# Patient Record
Sex: Female | Born: 1985 | Race: Black or African American | Hispanic: No | Marital: Single | Smoking: Current every day smoker
Health system: Southern US, Community
[De-identification: ages and names within clinical notes are randomized; demographics above are authoritative.]

## PROBLEM LIST (undated history)

## (undated) DIAGNOSIS — G43909 Migraine, unspecified, not intractable, without status migrainosus: Secondary | ICD-10-CM

## (undated) DIAGNOSIS — R011 Cardiac murmur, unspecified: Secondary | ICD-10-CM

## (undated) DIAGNOSIS — N939 Abnormal uterine and vaginal bleeding, unspecified: Secondary | ICD-10-CM

## (undated) HISTORY — PX: WISDOM TOOTH EXTRACTION: SHX21

---
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
## 2007-06-04 ENCOUNTER — Emergency Department (HOSPITAL_COMMUNITY): Admission: EM | Admit: 2007-06-04 | Discharge: 2007-06-04 | Payer: Self-pay | Admitting: Family Medicine

## 2008-02-18 ENCOUNTER — Inpatient Hospital Stay (HOSPITAL_COMMUNITY): Admission: AD | Admit: 2008-02-18 | Discharge: 2008-02-19 | Payer: Self-pay | Admitting: Obstetrics & Gynecology

## 2008-04-10 ENCOUNTER — Ambulatory Visit (HOSPITAL_COMMUNITY): Admission: RE | Admit: 2008-04-10 | Discharge: 2008-04-10 | Payer: Self-pay | Admitting: Obstetrics

## 2008-07-23 ENCOUNTER — Inpatient Hospital Stay (HOSPITAL_COMMUNITY): Admission: AD | Admit: 2008-07-23 | Discharge: 2008-08-01 | Payer: Self-pay | Admitting: Obstetrics

## 2008-07-30 ENCOUNTER — Encounter (INDEPENDENT_AMBULATORY_CARE_PROVIDER_SITE_OTHER): Payer: Self-pay | Admitting: Obstetrics

## 2008-09-24 ENCOUNTER — Emergency Department (HOSPITAL_COMMUNITY): Admission: EM | Admit: 2008-09-24 | Discharge: 2008-09-24 | Payer: Self-pay | Admitting: Emergency Medicine

## 2009-05-18 ENCOUNTER — Emergency Department (HOSPITAL_COMMUNITY): Admission: EM | Admit: 2009-05-18 | Discharge: 2009-05-18 | Payer: Self-pay | Admitting: Emergency Medicine

## 2009-09-27 ENCOUNTER — Emergency Department (HOSPITAL_COMMUNITY): Admission: EM | Admit: 2009-09-27 | Discharge: 2009-09-27 | Payer: Self-pay | Admitting: Emergency Medicine

## 2009-10-27 ENCOUNTER — Emergency Department (HOSPITAL_COMMUNITY): Admission: EM | Admit: 2009-10-27 | Discharge: 2009-10-27 | Payer: Self-pay | Admitting: Emergency Medicine

## 2010-03-16 LAB — CBC
MCH: 25.4 pg — ABNORMAL LOW (ref 26.0–34.0)
MCHC: 32.2 g/dL (ref 30.0–36.0)
MCV: 79.1 fL (ref 78.0–100.0)
Platelets: 164 10*3/uL (ref 150–400)
RDW: 14.2 % (ref 11.5–15.5)

## 2010-03-16 LAB — DIFFERENTIAL
Basophils Absolute: 0 10*3/uL (ref 0.0–0.1)
Lymphocytes Relative: 43 % (ref 12–46)
Lymphs Abs: 3.2 10*3/uL (ref 0.7–4.0)
Monocytes Absolute: 0.4 10*3/uL (ref 0.1–1.0)
Neutro Abs: 3.6 10*3/uL (ref 1.7–7.7)

## 2010-03-16 LAB — COMPREHENSIVE METABOLIC PANEL
AST: 20 U/L (ref 0–37)
Albumin: 4.3 g/dL (ref 3.5–5.2)
BUN: 14 mg/dL (ref 6–23)
Calcium: 9.6 mg/dL (ref 8.4–10.5)
Creatinine, Ser: 0.74 mg/dL (ref 0.4–1.2)
GFR calc Af Amer: >60 mL/min (ref >60)
Total Bilirubin: 0.5 mg/dL (ref 0.3–1.2)
Total Protein: 7.1 g/dL (ref 6.0–8.3)

## 2010-03-16 LAB — URINALYSIS, ROUTINE W REFLEX MICROSCOPIC
Glucose, UA: NEGATIVE mg/dL
Protein, ur: NEGATIVE mg/dL
Specific Gravity, Urine: 1.034 — ABNORMAL HIGH (ref 1.005–1.030)
Urobilinogen, UA: 1 mg/dL (ref 0.0–1.0)

## 2010-03-16 LAB — URINE CULTURE
Colony Count: NO GROWTH
Culture  Setup Time: 201110262100

## 2010-03-16 LAB — URINE MICROSCOPIC-ADD ON

## 2010-03-16 LAB — RAPID URINE DRUG SCREEN, HOSP PERFORMED: Barbiturates: NOT DETECTED

## 2010-03-21 LAB — DIFFERENTIAL
Basophils Relative: 1 % (ref 0–1)
Eosinophils Absolute: 0.2 10*3/uL (ref 0.0–0.7)
Eosinophils Relative: 2 % (ref 0–5)
Monocytes Absolute: 0.6 10*3/uL (ref 0.1–1.0)
Monocytes Relative: 7 % (ref 3–12)

## 2010-03-21 LAB — POCT I-STAT, CHEM 8
Chloride: 109 mEq/L (ref 96–112)
Creatinine, Ser: 0.9 mg/dL (ref 0.4–1.2)
Glucose, Bld: 95 mg/dL (ref 70–99)
HCT: 37 % (ref 36.0–46.0)
Hemoglobin: 12.6 g/dL (ref 12.0–15.0)
Potassium: 4.1 mEq/L (ref 3.5–5.1)
Sodium: 137 mEq/L (ref 135–145)

## 2010-03-21 LAB — CBC
Hemoglobin: 11.9 g/dL — ABNORMAL LOW (ref 12.0–15.0)
MCHC: 33.4 g/dL (ref 30.0–36.0)
MCV: 79.9 fL (ref 78.0–100.0)
RBC: 4.44 MIL/uL (ref 3.87–5.11)

## 2010-04-10 LAB — CBC
HCT: 33.2 % — ABNORMAL LOW (ref 36.0–46.0)
Hemoglobin: 11.4 g/dL — ABNORMAL LOW (ref 12.0–15.0)
MCHC: 32.8 g/dL (ref 30.0–36.0)
MCHC: 33.8 g/dL (ref 30.0–36.0)
MCHC: 34.1 g/dL (ref 30.0–36.0)
MCV: 82.9 fL (ref 78.0–100.0)
MCV: 83.1 fL (ref 78.0–100.0)
MCV: 83.1 fL (ref 78.0–100.0)
Platelets: 162 10*3/uL (ref 150–400)
Platelets: 192 10*3/uL (ref 150–400)
RBC: 3.49 MIL/uL — ABNORMAL LOW (ref 3.87–5.11)
RBC: 4.01 MIL/uL (ref 3.87–5.11)
RBC: 4.18 MIL/uL (ref 3.87–5.11)
RDW: 14.6 % (ref 11.5–15.5)
WBC: 10.5 10*3/uL (ref 4.0–10.5)
WBC: 10.8 10*3/uL — ABNORMAL HIGH (ref 4.0–10.5)
WBC: 10.9 10*3/uL — ABNORMAL HIGH (ref 4.0–10.5)
WBC: 9.4 10*3/uL (ref 4.0–10.5)

## 2010-04-10 LAB — URINALYSIS, ROUTINE W REFLEX MICROSCOPIC
Bilirubin Urine: NEGATIVE
Glucose, UA: NEGATIVE mg/dL
Ketones, ur: NEGATIVE mg/dL
Protein, ur: 100 mg/dL — AB
Urobilinogen, UA: 0.2 mg/dL (ref 0.0–1.0)

## 2010-04-10 LAB — COMPREHENSIVE METABOLIC PANEL
ALT: 13 U/L (ref 0–35)
ALT: 13 U/L (ref 0–35)
ALT: 14 U/L (ref 0–35)
AST: 22 U/L (ref 0–37)
AST: 23 U/L (ref 0–37)
AST: 25 U/L (ref 0–37)
Albumin: 2.8 g/dL — ABNORMAL LOW (ref 3.5–5.2)
Alkaline Phosphatase: 103 U/L (ref 39–117)
BUN: 5 mg/dL — ABNORMAL LOW (ref 6–23)
CO2: 20 mEq/L (ref 19–32)
CO2: 22 mEq/L (ref 19–32)
CO2: 25 mEq/L (ref 19–32)
Calcium: 7.3 mg/dL — ABNORMAL LOW (ref 8.4–10.5)
Calcium: 9 mg/dL (ref 8.4–10.5)
Chloride: 103 mEq/L (ref 96–112)
Chloride: 108 mEq/L (ref 96–112)
Chloride: 108 mEq/L (ref 96–112)
Chloride: 109 mEq/L (ref 96–112)
Creatinine, Ser: 0.63 mg/dL (ref 0.4–1.2)
GFR calc Af Amer: >60 mL/min (ref >60)
GFR calc Af Amer: >60 mL/min (ref >60)
GFR calc Af Amer: >60 mL/min (ref >60)
GFR calc non Af Amer: >60 mL/min (ref >60)
GFR calc non Af Amer: >60 mL/min (ref >60)
GFR calc non Af Amer: >60 mL/min (ref >60)
GFR calc non Af Amer: >60 mL/min (ref >60)
Glucose, Bld: 93 mg/dL (ref 70–99)
Sodium: 134 mEq/L — ABNORMAL LOW (ref 135–145)
Sodium: 136 mEq/L (ref 135–145)
Sodium: 138 mEq/L (ref 135–145)
Total Bilirubin: 0.4 mg/dL (ref 0.3–1.2)
Total Bilirubin: 0.5 mg/dL (ref 0.3–1.2)
Total Bilirubin: 0.6 mg/dL (ref 0.3–1.2)

## 2010-04-10 LAB — CREATININE CLEARANCE, URINE, 24 HOUR
Creatinine Clearance: 196 mL/min — ABNORMAL HIGH (ref 75–115)
Creatinine, 24H Ur: 1783 mg/d (ref 700–1800)
Creatinine, Urine: 59.4 mg/dL
Creatinine: 0.63 mg/dL (ref 0.40–1.20)

## 2010-04-10 LAB — URINE MICROSCOPIC-ADD ON

## 2010-04-10 LAB — PROTEIN, URINE, 24 HOUR: Urine Total Volume-UPROT: 3000 mL

## 2010-04-10 LAB — LACTATE DEHYDROGENASE: LDH: 139 U/L (ref 94–250)

## 2010-04-19 LAB — URINALYSIS, ROUTINE W REFLEX MICROSCOPIC
Glucose, UA: NEGATIVE mg/dL
Ketones, ur: NEGATIVE mg/dL
Specific Gravity, Urine: >1.03 — ABNORMAL HIGH (ref 1.005–1.030)
pH: 6 (ref 5.0–8.0)

## 2010-04-19 LAB — CBC
HCT: 32.3 % — ABNORMAL LOW (ref 36.0–46.0)
Hemoglobin: 10.6 g/dL — ABNORMAL LOW (ref 12.0–15.0)
RBC: 3.91 MIL/uL (ref 3.87–5.11)
RDW: 13.9 % (ref 11.5–15.5)
WBC: 9.4 10*3/uL (ref 4.0–10.5)

## 2010-04-19 LAB — WET PREP, GENITAL
Trich, Wet Prep: NONE SEEN
Yeast Wet Prep HPF POC: NONE SEEN

## 2010-05-17 NOTE — Discharge Summary (Signed)
[name], [name]             ACCOUNT NO.:  [id number]   MEDICAL RECORD NO.:  [id number]          PATIENT TYPE:  INP   LOCATION:  9146                          FACILITY:  [facility]   PHYSICIAN:  Kathreen Cosier, M.D.DATE OF BIRTH:  [birth year]   DATE OF ADMISSION:  07/23/2008  DATE OF DISCHARGE:                               DISCHARGE SUMMARY   The patient is a 25 year old gravida 1, EDC August 19, 2008.  She was  seen in the office on July 22 with a blood pressure of 130/100,  asymptomatic.  PIH labs were normal with her blood pressures on  admission were in the 90s.  She was admitted and seen by MFM.  A 24-hour  urine collection showed greater than the 3100 g of protein.  Her blood  pressures remained normal and MFM suggested induction at 37 weeks, so  she was induced on July 29, 2008.  Her PIH labs were normal.  Platelets  180.  She had Cytotec inserted and then had a vaginal delivery of female  Apgars 3 and 8, weighing 4 pound and 11ounces, pH was 7.33.  She was on  magnesium sulfate during labor and was transferred to the unit  postdelivery.  Her blood pressures remained normal.  Her magnesium was  discontinued by day #1 postpartum, and by day #2, blood pressures had  all remained normal.  She was discharged home on the second postpartum  day ambulatory and on a regular diet, on Tylenol No. 3 for pain and  ferrous sulfate for her anemia, hemoglobin being 9.7.           ______________________________  Kathreen Cosier, M.D.     BAM/MEDQ  D:  08/01/2008  T:  08/01/2008  Job:  [id number]

## 2010-09-29 LAB — POCT PREGNANCY, URINE: Preg Test, Ur: NEGATIVE

## 2010-12-11 ENCOUNTER — Emergency Department (HOSPITAL_COMMUNITY)
Admission: EM | Admit: 2010-12-11 | Discharge: 2010-12-11 | Disposition: A | Discharge status: Home / Self Care | Payer: Medicaid Other | Attending: Emergency Medicine | Admitting: Emergency Medicine

## 2010-12-11 ENCOUNTER — Encounter: Payer: Self-pay | Admitting: Emergency Medicine

## 2010-12-11 DIAGNOSIS — J069 Acute upper respiratory infection, unspecified: Secondary | ICD-10-CM | POA: Insufficient documentation

## 2010-12-11 DIAGNOSIS — B9789 Other viral agents as the cause of diseases classified elsewhere: Secondary | ICD-10-CM | POA: Insufficient documentation

## 2010-12-11 DIAGNOSIS — B349 Viral infection, unspecified: Secondary | ICD-10-CM

## 2010-12-11 DIAGNOSIS — R070 Pain in throat: Secondary | ICD-10-CM | POA: Insufficient documentation

## 2010-12-11 HISTORY — DX: Cardiac murmur, unspecified: R01.1

## 2010-12-11 MED ORDER — ALBUTEROL SULFATE HFA 108 (90 BASE) MCG/ACT IN AERS: 2.0000 | INHALATION_SPRAY | RESPIRATORY_TRACT | Status: DC | PRN

## 2010-12-11 MED ORDER — IBUPROFEN 200 MG PO TABS
600.0000 mg | ORAL_TABLET | Freq: Once | ORAL | Status: AC
  Administered 2010-12-11: 600 mg via ORAL
  Filled 2010-12-11: qty 3

## 2010-12-11 NOTE — ED Provider Notes (Addendum)
History     CSN: [id number] Arrival date & time: 12/11/2010  6:42 PM   First MD Initiated Contact with Patient 12/11/10 1933      Chief Complaint  Patient presents with  . Sore Throat    (Consider location/radiation/quality/duration/timing/severity/associated sxs/prior treatment) HPI Pt c/o sore/scratchy throat, body aches, nasal congestion, non productive cough for past 1-2 days. No fever. No known ill contacts. No trouble breathing or swallowing. No abd pain. No  Nvd. Symptoms constant since onset, no specific exacerbating or alleviating factors.     Past Medical History  Diagnosis Date  . Heart murmur     History reviewed. No pertinent past surgical history.  No family history on file.  History  Substance Use Topics  . Smoking status: Never Smoker   . Smokeless tobacco: Not on file  . Alcohol Use: No    OB History    Grav Para Term Preterm Abortions TAB SAB Ect Mult Living                  Review of Systems  Constitutional: Negative for fever and chills.  Respiratory: Negative for shortness of breath.   Cardiovascular: Negative for chest pain.  Skin: Negative for rash.  Neurological: Negative for headaches.    Allergies  Review of patient's allergies indicates no known allergies.  Home Medications   Current Outpatient Rx  Name Route Sig Dispense Refill  . OVER THE COUNTER MEDICATION Oral Take 2 tablets by mouth every 6 (six) hours. Tylenol cold and flu       BP 122/71  Pulse 90  Temp(Src) 98.2 F (36.8 C) (Oral)  Resp 18  SpO2 100%  LMP 12/04/2010  Physical Exam  Nursing note and vitals reviewed. Constitutional: She is oriented to person, place, and time. She appears well-developed and well-nourished. No distress.  HENT:  Mouth/Throat: Oropharynx is clear and moist.  Eyes: Conjunctivae are normal. No scleral icterus.  Neck: Neck supple. No tracheal deviation present.       No stiffness or rigidity  Cardiovascular: Normal rate, regular  rhythm, normal heart sounds and intact distal pulses.  Exam reveals no gallop and no friction rub.   No murmur heard. Pulmonary/Chest: Effort normal and breath sounds normal. No respiratory distress.  Abdominal: Soft. Normal appearance. She exhibits no distension. There is no tenderness.  Musculoskeletal: She exhibits no edema.  Neurological: She is alert and oriented to person, place, and time.  Skin: Skin is warm and dry. No rash noted.  Psychiatric: She has a normal mood and affect.    ED Course  Procedures (including critical care time)     MDM   Motrin po. pts exam/symptoms c/w viral uri.        Suzi Roots, MD 12/11/10 1941  Suzi Roots, MD 03/18/11 [phone]

## 2010-12-11 NOTE — ED Notes (Signed)
Patient states has been feeling sick x 3 days reports fevers and body aches with a productive cough

## 2010-12-11 NOTE — ED Notes (Signed)
C/o sore throat, sob, generalized body aches, fever, congestion, and productive cough with clear sputum since yesterday.

## 2010-12-12 ENCOUNTER — Emergency Department (HOSPITAL_COMMUNITY)
Admission: EM | Admit: 2010-12-12 | Discharge: 2010-12-12 | Disposition: A | Discharge status: Home / Self Care | Payer: Medicaid Other | Attending: Emergency Medicine | Admitting: Emergency Medicine

## 2010-12-12 ENCOUNTER — Encounter (HOSPITAL_COMMUNITY): Payer: Self-pay

## 2010-12-12 DIAGNOSIS — IMO0001 Reserved for inherently not codable concepts without codable children: Secondary | ICD-10-CM | POA: Insufficient documentation

## 2010-12-12 DIAGNOSIS — H9210 Otorrhea, unspecified ear: Secondary | ICD-10-CM | POA: Insufficient documentation

## 2010-12-12 DIAGNOSIS — J208 Acute bronchitis due to other specified organisms: Secondary | ICD-10-CM

## 2010-12-12 DIAGNOSIS — J209 Acute bronchitis, unspecified: Secondary | ICD-10-CM | POA: Insufficient documentation

## 2010-12-12 DIAGNOSIS — R0602 Shortness of breath: Secondary | ICD-10-CM | POA: Insufficient documentation

## 2010-12-12 DIAGNOSIS — R011 Cardiac murmur, unspecified: Secondary | ICD-10-CM | POA: Insufficient documentation

## 2010-12-12 MED ORDER — IBUPROFEN 800 MG PO TABS
800.0000 mg | ORAL_TABLET | Freq: Once | ORAL | Status: AC
  Administered 2010-12-12: 800 mg via ORAL
  Filled 2010-12-12 (×2): qty 1

## 2010-12-12 MED ORDER — DIPHENHYDRAMINE HCL 25 MG PO CAPS
25.0000 mg | ORAL_CAPSULE | Freq: Four times a day (QID) | ORAL | Status: DC | PRN
  Administered 2010-12-12: 25 mg via ORAL
  Filled 2010-12-12 (×2): qty 1

## 2010-12-12 NOTE — ED Notes (Signed)
Pt was seen last night and given rx for albuteral has not gotten it filled states has gotten a rash on her left fingers and marks on her neck and left shoulder pt states that she is still having trouble breathing. Pt is hoarse

## 2010-12-12 NOTE — ED Notes (Signed)
Patient presents with shortness of breath since last night after taking albuterol and tylenol cold and sinus. Patient seen yesterday in ED for upper respiratory infection.  No wheezing noted upon auscultation.  Patient speaking in short, complete sentences.

## 2011-01-13 NOTE — ED Provider Notes (Signed)
History     CSN: [id number]  Arrival date & time 12/12/10  7846   First MD Initiated Contact with Patient 12/12/10 1102      Chief Complaint  Patient presents with  . Shortness of Breath    (Consider location/radiation/quality/duration/timing/severity/associated sxs/prior treatment) Patient is a 26 y.o. female presenting with shortness of breath. The history is provided by the patient. No language interpreter was used.  Shortness of Breath  The current episode started today. The onset was gradual. The problem occurs occasionally. The problem has been unchanged. The problem is mild. The symptoms are relieved by nothing. Associated symptoms include shortness of breath. Pertinent negatives include no chest pain, no chest pressure, no orthopnea, no fever, no rhinorrhea, no sore throat, no cough and no wheezing. She was not exposed to toxic fumes. She has been behaving normally. Recently, medical care has been given at this facility.    Past Medical History  Diagnosis Date  . Heart murmur     History reviewed. No pertinent past surgical history.  History reviewed. No pertinent family history.  History  Substance Use Topics  . Smoking status: Never Smoker   . Smokeless tobacco: Not on file  . Alcohol Use: No    OB History    Grav Para Term Preterm Abortions TAB SAB Ect Mult Living                  Review of Systems  Constitutional: Negative for fever.  HENT: Negative for sore throat and rhinorrhea.   Respiratory: Positive for shortness of breath. Negative for cough and wheezing.   Cardiovascular: Negative for chest pain and orthopnea.  All other systems reviewed and are negative.    Allergies  Acetaminophen and Codeine  Home Medications   Current Outpatient Rx  Name Route Sig Dispense Refill  . ALBUTEROL SULFATE HFA 108 (90 BASE) MCG/ACT IN AERS Inhalation Inhale 2 puffs into the lungs every 4 (four) hours as needed for wheezing. 1 Inhaler 0  . DIPHENHYDRAMINE  HCL 2 % EX CREA Topical Apply 1 application topically 3 (three) times daily as needed. For rash and itching     . OVER THE COUNTER MEDICATION Oral Take 2 tablets by mouth every 6 (six) hours. Tylenol cold and flu      BP 132/79  Pulse 96  Temp(Src) 97.8 F (36.6 C) (Oral)  Resp 18  SpO2 100%  LMP 12/04/2010  Physical Exam  Nursing note and vitals reviewed. Constitutional: She is oriented to person, place, and time. She appears well-nourished. No distress.  HENT:  Head: Normocephalic.  Right Ear: Hearing and tympanic membrane normal. No drainage or tenderness. Tympanic membrane is not injected.  Left Ear: Hearing and tympanic membrane normal. There is drainage. No tenderness. Tympanic membrane is not injected.  Mouth/Throat: Uvula is midline and mucous membranes are normal. No oropharyngeal exudate, posterior oropharyngeal edema, posterior oropharyngeal erythema or tonsillar abscesses.  Eyes: Pupils are equal, round, and reactive to light.  Neck: Normal range of motion.  Cardiovascular: Normal rate.   Murmur heard. Pulmonary/Chest: No respiratory distress. She has no wheezes.  Abdominal: Soft. She exhibits no distension. There is no tenderness.  Musculoskeletal: She exhibits tenderness.       General body aches  Neurological: She is alert and oriented to person, place, and time. No cranial nerve deficit.  Skin: Skin is warm and dry.  Psychiatric: She has a normal mood and affect.    ED Course  Procedures (including critical care time)  Labs Reviewed - No data to display No results found.   1. Viral bronchitis       MDM  General body aches and pains with ? Fever.  No fever in the ER today or yesterday.  Will add ibuprofen and benadryl with good results in ER today.  Lungs clear.  Better after breathing treatment.  No tachycardia or SOB in ER today; Will follow up with a PCP this week.    Medical screening examination/treatment/procedure(s) were performed by  non-physician practitioner and as supervising physician I was immediately available for consultation/collaboration. Osvaldo Human, M.D.      Jethro Bastos, NP 01/13/11 1606  Carleene Cooper III, MD 01/14/11 1316

## 2012-01-15 ENCOUNTER — Emergency Department (HOSPITAL_COMMUNITY): Payer: Medicaid Other

## 2012-01-15 ENCOUNTER — Emergency Department (HOSPITAL_COMMUNITY)
Admission: EM | Admit: 2012-01-15 | Discharge: 2012-01-15 | Disposition: A | Discharge status: Home / Self Care | Payer: Medicaid Other | Attending: Emergency Medicine | Admitting: Emergency Medicine

## 2012-01-15 ENCOUNTER — Encounter (HOSPITAL_COMMUNITY): Payer: Self-pay

## 2012-01-15 DIAGNOSIS — G43909 Migraine, unspecified, not intractable, without status migrainosus: Secondary | ICD-10-CM | POA: Insufficient documentation

## 2012-01-15 DIAGNOSIS — R079 Chest pain, unspecified: Secondary | ICD-10-CM | POA: Insufficient documentation

## 2012-01-15 DIAGNOSIS — R110 Nausea: Secondary | ICD-10-CM | POA: Insufficient documentation

## 2012-01-15 DIAGNOSIS — H53149 Visual discomfort, unspecified: Secondary | ICD-10-CM | POA: Insufficient documentation

## 2012-01-15 DIAGNOSIS — K219 Gastro-esophageal reflux disease without esophagitis: Secondary | ICD-10-CM | POA: Insufficient documentation

## 2012-01-15 DIAGNOSIS — R011 Cardiac murmur, unspecified: Secondary | ICD-10-CM | POA: Insufficient documentation

## 2012-01-15 HISTORY — DX: Migraine, unspecified, not intractable, without status migrainosus: G43.909

## 2012-01-15 LAB — CBC WITH DIFFERENTIAL/PLATELET
Basophils Relative: 1 % (ref 0–1)
Eosinophils Absolute: 0.1 10*3/uL (ref 0.0–0.7)
Eosinophils Relative: 2 % (ref 0–5)
MCH: 24.6 pg — ABNORMAL LOW (ref 26.0–34.0)
MCHC: 31.5 g/dL (ref 30.0–36.0)
MCV: 78.1 fL (ref 78.0–100.0)
Neutrophils Relative %: 53 % (ref 43–77)
Platelets: 219 10*3/uL (ref 150–400)

## 2012-01-15 LAB — BASIC METABOLIC PANEL
BUN: 16 mg/dL (ref 6–23)
Calcium: 9.7 mg/dL (ref 8.4–10.5)
GFR calc Af Amer: >90 mL/min (ref >90)
GFR calc non Af Amer: >90 mL/min (ref >90)
Potassium: 4 mEq/L (ref 3.5–5.1)
Sodium: 140 mEq/L (ref 135–145)

## 2012-01-15 MED ORDER — DIPHENHYDRAMINE HCL 50 MG/ML IJ SOLN
25.0000 mg | Freq: Once | INTRAMUSCULAR | Status: AC
  Administered 2012-01-15: 25 mg via INTRAVENOUS
  Filled 2012-01-15: qty 1

## 2012-01-15 MED ORDER — DEXAMETHASONE SODIUM PHOSPHATE 10 MG/ML IJ SOLN
10.0000 mg | Freq: Once | INTRAMUSCULAR | Status: AC
  Administered 2012-01-15: 10 mg via INTRAVENOUS
  Filled 2012-01-15: qty 1

## 2012-01-15 MED ORDER — OXYCODONE-ACETAMINOPHEN 5-325 MG PO TABS: 1.0000 | ORAL_TABLET | Freq: Four times a day (QID) | ORAL | Status: DC | PRN

## 2012-01-15 MED ORDER — SODIUM CHLORIDE 0.9 % IV BOLUS (SEPSIS)
1000.0000 mL | Freq: Once | INTRAVENOUS | Status: AC
  Administered 2012-01-15: 1000 mL via INTRAVENOUS

## 2012-01-15 MED ORDER — FAMOTIDINE 40 MG PO TABS: 40.0000 mg | ORAL_TABLET | Freq: Two times a day (BID) | ORAL | Status: DC

## 2012-01-15 MED ORDER — METOCLOPRAMIDE HCL 5 MG/ML IJ SOLN
10.0000 mg | Freq: Once | INTRAMUSCULAR | Status: AC
  Administered 2012-01-15: 10 mg via INTRAVENOUS
  Filled 2012-01-15: qty 2

## 2012-01-15 NOTE — ED Provider Notes (Addendum)
History     CSN: [id number]  Arrival date & time 01/15/12  1317   First MD Initiated Contact with Patient 01/15/12 1710      Chief Complaint  Patient presents with  . Migraine    (Consider location/radiation/quality/duration/timing/severity/associated sxs/prior treatment) Patient is a 27 y.o. [name] presenting with migraines. The history is provided by the patient.  Migraine This is a chronic problem. Episode onset: 2 weeks ago. The problem occurs constantly. Progression since onset: waxing and waning but won't go away. Associated symptoms include chest pain and abdominal pain. Associated symptoms comments: Pain is worse in the chest while lying down and goes into her throat. The symptoms are aggravated by stress (bright lights). Nothing relieves the symptoms. She has tried acetaminophen (NSAIDs) for the symptoms. The treatment provided no relief.    Past Medical History  Diagnosis Date  . Heart murmur   . Migraines     History reviewed. No pertinent past surgical history.  History reviewed. No pertinent family history.  History  Substance Use Topics  . Smoking status: Never Smoker   . Smokeless tobacco: Not on file  . Alcohol Use: No    OB History    Grav Para Term Preterm Abortions TAB SAB Ect Mult Living                  Review of Systems  Constitutional: Negative for fever.  HENT: Negative for neck pain and neck stiffness.   Eyes: Positive for photophobia and visual disturbance.       Complains of blurry vision when in bright light  Cardiovascular: Positive for chest pain.  Gastrointestinal: Positive for nausea and abdominal pain. Negative for vomiting.  All other systems reviewed and are negative.    Allergies  Acetaminophen and Codeine  Home Medications  No current outpatient prescriptions on file.  BP 119/66  Pulse 88  Temp 98 F (Julie.7 C) (Oral)  Resp 18  SpO2 100%  LMP 12/20/2011  Physical Exam  Nursing note and vitals  reviewed. Constitutional: She is oriented to person, place, and time. She appears well-developed and well-nourished. She appears distressed.  HENT:  Head: Normocephalic and atraumatic.  Eyes: EOM are normal. Pupils are equal, round, and reactive to light.  Fundoscopic exam:      The right eye shows no papilledema.       The left eye shows no papilledema.  Neck: Normal range of motion. Neck supple.  Cardiovascular: Normal rate, regular rhythm, normal heart sounds and intact distal pulses.  Exam reveals no friction rub.   No murmur heard. Pulmonary/Chest: Effort normal and breath sounds normal. She has no wheezes. She has no rales.  Abdominal: Soft. Bowel sounds are normal. She exhibits no distension. There is no tenderness. There is no rebound and no guarding.  Musculoskeletal: Normal range of motion. She exhibits no tenderness.       No edema  Lymphadenopathy:    She has no cervical adenopathy.  Neurological: She is alert and oriented to person, place, and time. She has normal strength. No cranial nerve deficit or sensory deficit. Coordination and gait normal.       photophobia  Skin: Skin is warm and dry. No rash noted.  Psychiatric: She has a normal mood and affect. Her behavior is normal.    ED Course  Procedures (including critical care time)  Labs Reviewed  CBC WITH DIFFERENTIAL - Abnormal; Notable for the following:    Hemoglobin 11.6 (*)     [facility]  24.6 (*)     All other components within normal limits  BASIC METABOLIC PANEL   Ct Head Wo Contrast  01/15/2012  *RADIOLOGY REPORT*  Clinical Data: Migraines.  Left arm and foot numbness.  CT HEAD WITHOUT CONTRAST  Technique:  Contiguous axial images were obtained from the base of the skull through the vertex without contrast.  Comparison: None.  Findings: No evidence of acute infarct, acute hemorrhage, mass lesion, mass effect or hydrocephalus.  Visualized paranasal sinuses and mastoid air cells are clear.  IMPRESSION: Negative.    Original Report Authenticated By: Leanna Battles, M.D.     Date: 01/15/2012  Rate: 80  Rhythm: normal sinus rhythm  QRS Axis: normal  Intervals: normal  ST/T Wave abnormalities: nonspecific T wave changes  Conduction Disutrbances:none  Narrative Interpretation:   Old EKG Reviewed: unchanged    1. Migraine   2. GERD (gastroesophageal reflux disease)       MDM   Pt with typical migraine HA without sx suggestive of SAH(sudden onset, worst of life, or deficits), infection, or cavernous vein thrombosis.  Normal neuro exam and vital signs.  Patient states she's had headaches like this since she was 27 years old. She's had multiple CAT scans and seeing a neurologist and they have all diagnosed her with migraine headaches. She states she just can't get rid of this headache with Tylenol and ibuprofen Will give HA cocktail and on re-eval.  Secondly patient's complaining of chest pain. She states it's worse when she lays down and flexes up into her chest and her throat. This did not start until after she started taking multiple medications for her headaches. Suspect that this is GERD from recent NSAID use. She has no prior medical history except for migraines. She is 27 years old and have a low suspicion for any cardiac etiology at this time.  7:04 PM On re-eval pt's HA is improved.  EKG and labs wnl.  Will give zantac for GERD and told her to avoid NSAIDs and ASA.       Gwyneth Sprout, MD 01/15/12 1907  Gwyneth Sprout, MD 01/15/12 6295  Gwyneth Sprout, MD 01/15/12 2841

## 2012-01-15 NOTE — ED Notes (Addendum)
Pt states she has migraines and this headache has been bothering her since new year's eve. No nausea at present but while at work does make herself vomit to relieve pressure. Started having numbness in feet and arms and feet. Her left arm hurts to raise. Speech is clear. No numbness to face. Grip on left side is moderately weaker than right.

## 2012-02-16 ENCOUNTER — Encounter: Payer: Medicaid Other | Admitting: Obstetrics and Gynecology

## 2012-02-21 ENCOUNTER — Ambulatory Visit (INDEPENDENT_AMBULATORY_CARE_PROVIDER_SITE_OTHER): Payer: Medicaid Other | Admitting: Obstetrics & Gynecology

## 2012-02-21 ENCOUNTER — Encounter: Payer: Self-pay | Admitting: Obstetrics & Gynecology

## 2012-02-21 VITALS — BP 131/74 | HR 78 | Ht 67.0 in | Wt 195.0 lb

## 2012-02-21 DIAGNOSIS — Z3046 Encounter for surveillance of implantable subdermal contraceptive: Secondary | ICD-10-CM

## 2012-02-21 DIAGNOSIS — Z01812 Encounter for preprocedural laboratory examination: Secondary | ICD-10-CM

## 2012-02-21 DIAGNOSIS — Z30017 Encounter for initial prescription of implantable subdermal contraceptive: Secondary | ICD-10-CM

## 2012-02-21 DIAGNOSIS — Z3009 Encounter for other general counseling and advice on contraception: Secondary | ICD-10-CM

## 2012-02-21 NOTE — Patient Instructions (Signed)

## 2012-02-21 NOTE — Progress Notes (Signed)
Patient ID: Julie [name], female   DOB: May 05, 1985, 27 y.o.   MRN: [id number] Patient given informed consent for removal of her Implanon and insertion of Nexplanon, time out was performed. Pregnancy test was negative. Appropriate time out taken. Implanon site identified. Area prepped in usual sterile fashon. One ml of 1% lidocaine was used to anesthetize the area at the distal end of the implant. A small stab incision was made right beside the implant on the distal portion. The Nexplanon rod was grasped using hemostats and removed without difficulty. There was minimal blood loss. There were no complications. Area was then injected with 3 ml of 1 % lidocaine. She was re-prepped with betadine, Nexplanon removed from packaging, Device confirmed in needle, then inserted full length of needle and withdrawn per handbook instructions. Nexplanon was able to palpated in the patient's arm; patient palpated the insert herself.  There was minimal blood loss. Patient insertion site covered with guaze and a pressure bandage to reduce any bruising. The patient tolerated the procedure well and was given post procedure instructions. Return in about one month for Nexplanon check.  Patient also due for an annual and will f/u for PAP.

## 2012-02-21 NOTE — Addendum Note (Signed)
Addended by: Barbara Cower on: 02/21/2012 02:49 PM   Modules accepted: Orders

## 2012-02-21 NOTE — Progress Notes (Signed)
Unable to document urine pregnancy test due to changes happening in the system.  Urine pregnancy test was negative.

## 2012-03-07 ENCOUNTER — Encounter: Payer: Self-pay | Admitting: Obstetrics & Gynecology

## 2012-03-07 ENCOUNTER — Ambulatory Visit (INDEPENDENT_AMBULATORY_CARE_PROVIDER_SITE_OTHER): Payer: Medicaid Other | Admitting: Obstetrics & Gynecology

## 2012-03-07 ENCOUNTER — Other Ambulatory Visit (HOSPITAL_COMMUNITY)
Admission: RE | Admit: 2012-03-07 | Discharge: 2012-03-07 | Disposition: A | Discharge status: Home / Self Care | Payer: Medicaid Other | Source: Ambulatory Visit | Attending: Obstetrics & Gynecology | Admitting: Obstetrics & Gynecology

## 2012-03-07 VITALS — BP 129/82 | HR 72 | Ht 66.5 in | Wt 196.4 lb

## 2012-03-07 DIAGNOSIS — Z0000 Encounter for general adult medical examination without abnormal findings: Secondary | ICD-10-CM

## 2012-03-07 DIAGNOSIS — Z124 Encounter for screening for malignant neoplasm of cervix: Secondary | ICD-10-CM

## 2012-03-07 DIAGNOSIS — L658 Other specified nonscarring hair loss: Secondary | ICD-10-CM

## 2012-03-07 DIAGNOSIS — Z7189 Other specified counseling: Secondary | ICD-10-CM

## 2012-03-07 DIAGNOSIS — Z113 Encounter for screening for infections with a predominantly sexual mode of transmission: Secondary | ICD-10-CM

## 2012-03-07 DIAGNOSIS — Z01419 Encounter for gynecological examination (general) (routine) without abnormal findings: Secondary | ICD-10-CM

## 2012-03-07 DIAGNOSIS — N760 Acute vaginitis: Secondary | ICD-10-CM | POA: Insufficient documentation

## 2012-03-07 DIAGNOSIS — Z23 Encounter for immunization: Secondary | ICD-10-CM

## 2012-03-07 DIAGNOSIS — Z3040 Encounter for surveillance of contraceptives, unspecified: Secondary | ICD-10-CM

## 2012-03-07 DIAGNOSIS — L659 Nonscarring hair loss, unspecified: Secondary | ICD-10-CM

## 2012-03-07 MED ORDER — INFLUENZA VIRUS VACC SPLIT PF IM SUSP
0.5000 mL | Freq: Once | INTRAMUSCULAR | Status: AC
  Administered 2012-03-07: 0.5 mL via INTRAMUSCULAR

## 2012-03-07 NOTE — Progress Notes (Signed)
  Subjective:     Julie [name] is a 27 y.o. G64P0101 female and is here for a comprehensive gynecologic physical exam. The patient reports having alopecia; the hair loss is in the front and back of her head.  No recent stressors or chronic illnesses, or exposure to any toxic substances.  She wants laboratory evaluation for this.  Also desires STI screen.  She had a Nexplanon inserted last month, no concerns. Has not received Gardasil series yet, wants to get this today in addition to flu vaccination. No problems with sexual intercourse; denies other GYN concerns  History   Social History  . Marital Status: Single    Spouse Name: N/A    Number of Children: N/A  . Years of Education: N/A   Occupational History  . Not on file.   Social History Main Topics  . Smoking status: Never Smoker   . Smokeless tobacco: Not on file  . Alcohol Use: No  . Drug Use: No  . Sexually Active: Yes -- Female partner(s)    Birth Control/ Protection: Implant   Other Topics Concern  . Not on file   Social History Narrative  . No narrative on file   Health Maintenance  Topic Date Due  . Influenza Vaccine  09/02/1988  . Pap Smear  12/15/2005  . Tetanus/tdap  12/16/2006   The following portions of the patient's history were reviewed and updated as appropriate: allergies, current medications, past family history, past medical history, past social history, past surgical history and problem list.  Review of Systems Pertinent items are noted in HPI.   Objective:  BP 129/82  Pulse 72  Ht 5' 6.5" (1.689 m)  Wt 196 lb 6.4 oz (89.086 kg)  BMI 31.23 kg/m2  LMP 02/16/2012 GENERAL: Well-developed, well-nourished female in no acute distress.  HEENT: Normocephalic, atraumatic. Sclerae anicteric. Alopecia noted on front and back of head NECK: Supple. Normal thyroid.  LUNGS: Clear to auscultation bilaterally.  HEART: Regular rate and rhythm. BREASTS: Symmetric in size. No masses, skin changes, nipple  drainage, or lymphadenopathy. ABDOMEN: Soft, nontender, nondistended. No organomegaly. PELVIC: Normal external female genitalia. Vagina is pink and rugated.  Normal discharge. Normal cervix contour. Pap smear obtained. Uterus is normal in size. No adnexal mass or tenderness.  EXTREMITIES: No cyanosis, clubbing, or edema, 2+ distal pulses on BLE.  Nexplanon palpated in upper left arm, no erythema, no tenderness or other concerning signs/symptoms.   Assessment:   Healthy female exam Normal Nexplanon check Desires STI screen Desires alopecia evaluation Desires flu and HPV vaccinations     Plan:   Pap and STI screen done,will follow up results and manage accordingly. Alopecia evaluation labs done, patient advised to follow up with Dermatologist for further evaluation and management.  We can send report of labs to any dermatologist when requested. Flu and HPV vaccines administered after detailed counseling Normal Nexplanon check, she was told to call with any concerns Routine preventative health maintenance measures emphasized

## 2012-03-07 NOTE — Patient Instructions (Signed)
Preventive Care for Adults, Female A healthy lifestyle and preventive care can promote health and wellness. Preventive health guidelines for women include the following key practices.  A routine yearly physical is a good way to check with your caregiver about your health and preventive screening. It is a chance to share any concerns and updates on your health, and to receive a thorough exam.  Visit your dentist for a routine exam and preventive care every 6 months. Brush your teeth twice a day and floss once a day. Good oral hygiene prevents tooth decay and gum disease.  The frequency of eye exams is based on your age, health, family medical history, use of contact lenses, and other factors. Follow your caregiver's recommendations for frequency of eye exams.  Eat a healthy diet. Foods like vegetables, fruits, whole grains, low-fat dairy products, and lean protein foods contain the nutrients you need without too many calories. Decrease your intake of foods high in solid fats, added sugars, and salt. Eat the right amount of calories for you.Get information about a proper diet from your caregiver, if necessary.  Regular physical exercise is one of the most important things you can do for your health. Most adults should get at least 150 minutes of moderate-intensity exercise (any activity that increases your heart rate and causes you to sweat) each week. In addition, most adults need muscle-strengthening exercises on 2 or more days a week.  Maintain a healthy weight. The body mass index (BMI) is a screening tool to identify possible weight problems. It provides an estimate of body fat based on height and weight. Your caregiver can help determine your BMI, and can help you achieve or maintain a healthy weight.For adults 20 years and older:  A BMI below 18.5 is considered underweight.  A BMI of 18.5 to 24.9 is normal.  A BMI of 25 to 29.9 is considered overweight.  A BMI of 30 and above is  considered obese.  Maintain normal blood lipids and cholesterol levels by exercising and minimizing your intake of saturated fat. Eat a balanced diet with plenty of fruit and vegetables. Blood tests for lipids and cholesterol should begin at age 20 and be repeated every 5 years. If your lipid or cholesterol levels are high, you are over 50, or you are at high risk for heart disease, you may need your cholesterol levels checked more frequently.Ongoing high lipid and cholesterol levels should be treated with medicines if diet and exercise are not effective.  If you smoke, find out from your caregiver how to quit. If you do not use tobacco, do not start.  If you are pregnant, do not drink alcohol. If you are breastfeeding, be very cautious about drinking alcohol. If you are not pregnant and choose to drink alcohol, do not exceed 1 drink per day. One drink is considered to be 12 ounces (355 mL) of beer, 5 ounces (148 mL) of wine, or 1.5 ounces (44 mL) of liquor.  Avoid use of street drugs. Do not share needles with anyone. Ask for help if you need support or instructions about stopping the use of drugs.  High blood pressure causes heart disease and increases the risk of stroke. Your blood pressure should be checked at least every 1 to 2 years. Ongoing high blood pressure should be treated with medicines if weight loss and exercise are not effective.  If you are 55 to 25 years old, ask your caregiver if you should take aspirin to prevent strokes.  Diabetes   screening involves taking a blood sample to check your fasting blood sugar level. This should be done once every 3 years, after age 45, if you are within normal weight and without risk factors for diabetes. Testing should be considered at a younger age or be carried out more frequently if you are overweight and have at least 1 risk factor for diabetes.  Breast cancer screening is essential preventive care for women. You should practice "breast  self-awareness." This means understanding the normal appearance and feel of your breasts and may include breast self-examination. Any changes detected, no matter how small, should be reported to a caregiver. Women in their 20s and 30s should have a clinical breast exam (CBE) by a caregiver as part of a regular health exam every 1 to 3 years. After age 40, women should have a CBE every year. Starting at age 40, women should consider having a mammography (breast X-ray test) every year. Women who have a family history of breast cancer should talk to their caregiver about genetic screening. Women at a high risk of breast cancer should talk to their caregivers about having magnetic resonance imaging (MRI) and a mammography every year.  The Pap test is a screening test for cervical cancer. A Pap test can show cell changes on the cervix that might become cervical cancer if left untreated. A Pap test is a procedure in which cells are obtained and examined from the lower end of the uterus (cervix).  Women should have a Pap test starting at age 21.  Between ages 21 and 29, Pap tests should be repeated every 2 years.  Beginning at age 30, you should have a Pap test every 3 years as long as the past 3 Pap tests have been normal.  Some women have medical problems that increase the chance of getting cervical cancer. Talk to your caregiver about these problems. It is especially important to talk to your caregiver if a new problem develops soon after your last Pap test. In these cases, your caregiver may recommend more frequent screening and Pap tests.  The above recommendations are the same for women who have or have not gotten the vaccine for human papillomavirus (HPV).  If you had a hysterectomy for a problem that was not cancer or a condition that could lead to cancer, then you no longer need Pap tests. Even if you no longer need a Pap test, a regular exam is a good idea to make sure no other problems are  starting.  If you are between ages 65 and 70, and you have had normal Pap tests going back 10 years, you no longer need Pap tests. Even if you no longer need a Pap test, a regular exam is a good idea to make sure no other problems are starting.  If you have had past treatment for cervical cancer or a condition that could lead to cancer, you need Pap tests and screening for cancer for at least 20 years after your treatment.  If Pap tests have been discontinued, risk factors (such as a new sexual partner) need to be reassessed to determine if screening should be resumed.  The HPV test is an additional test that may be used for cervical cancer screening. The HPV test looks for the virus that can cause the cell changes on the cervix. The cells collected during the Pap test can be tested for HPV. The HPV test could be used to screen women aged 30 years and older, and should   be used in women of any age who have unclear Pap test results. After the age of 30, women should have HPV testing at the same frequency as a Pap test.  Colorectal cancer can be detected and often prevented. Most routine colorectal cancer screening begins at the age of 50 and continues through age 75. However, your caregiver may recommend screening at an earlier age if you have risk factors for colon cancer. On a yearly basis, your caregiver may provide home test kits to check for hidden blood in the stool. Use of a small camera at the end of a tube, to directly examine the colon (sigmoidoscopy or colonoscopy), can detect the earliest forms of colorectal cancer. Talk to your caregiver about this at age 50, when routine screening begins. Direct examination of the colon should be repeated every 5 to 10 years through age 75, unless early forms of pre-cancerous polyps or small growths are found.  Hepatitis C blood testing is recommended for all people born from 1945 through 1965 and any individual with known risks for hepatitis C.  Practice  safe sex. Use condoms and avoid high-risk sexual practices to reduce the spread of sexually transmitted infections (STIs). STIs include gonorrhea, chlamydia, syphilis, trichomonas, herpes, HPV, and human immunodeficiency virus (HIV). Herpes, HIV, and HPV are viral illnesses that have no cure. They can result in disability, cancer, and death. Sexually active women aged 25 and younger should be checked for chlamydia. Older women with new or multiple partners should also be tested for chlamydia. Testing for other STIs is recommended if you are sexually active and at increased risk.  Osteoporosis is a disease in which the bones lose minerals and strength with aging. This can result in serious bone fractures. The risk of osteoporosis can be identified using a bone density scan. Women ages 65 and over and women at risk for fractures or osteoporosis should discuss screening with their caregivers. Ask your caregiver whether you should take a calcium supplement or vitamin D to reduce the rate of osteoporosis.  Menopause can be associated with physical symptoms and risks. Hormone replacement therapy is available to decrease symptoms and risks. You should talk to your caregiver about whether hormone replacement therapy is right for you.  Use sunscreen with sun protection factor (SPF) of 30 or more. Apply sunscreen liberally and repeatedly throughout the day. You should seek shade when your shadow is shorter than you. Protect yourself by wearing long sleeves, pants, a wide-brimmed hat, and sunglasses year round, whenever you are outdoors.  Once a month, do a whole body skin exam, using a mirror to look at the skin on your back. Notify your caregiver of new moles, moles that have irregular borders, moles that are larger than a pencil eraser, or moles that have changed in shape or color.  Stay current with required immunizations.  Influenza. You need a dose every fall (or winter). The composition of the flu vaccine  changes each year, so being vaccinated once is not enough.  Pneumococcal polysaccharide. You need 1 to 2 doses if you smoke cigarettes or if you have certain chronic medical conditions. You need 1 dose at age 65 (or older) if you have never been vaccinated.  Tetanus, diphtheria, pertussis (Tdap, Td). Get 1 dose of Tdap vaccine if you are younger than age 65, are over 65 and have contact with an infant, are a healthcare worker, are pregnant, or simply want to be protected from whooping cough. After that, you need a Td   booster dose every 10 years. Consult your caregiver if you have not had at least 3 tetanus and diphtheria-containing shots sometime in your life or have a deep or dirty wound.  HPV. You need this vaccine if you are a woman age 26 or younger. The vaccine is given in 3 doses over 6 months.  Measles, mumps, rubella (MMR). You need at least 1 dose of MMR if you were born in 1957 or later. You may also need a second dose.  Meningococcal. If you are age 19 to 21 and a first-year college student living in a residence hall, or have one of several medical conditions, you need to get vaccinated against meningococcal disease. You may also need additional booster doses.  Zoster (shingles). If you are age 60 or older, you should get this vaccine.  Varicella (chickenpox). If you have never had chickenpox or you were vaccinated but received only 1 dose, talk to your caregiver to find out if you need this vaccine.  Hepatitis A. You need this vaccine if you have a specific risk factor for hepatitis A virus infection or you simply wish to be protected from this disease. The vaccine is usually given as 2 doses, 6 to 18 months apart.  Hepatitis B. You need this vaccine if you have a specific risk factor for hepatitis B virus infection or you simply wish to be protected from this disease. The vaccine is given in 3 doses, usually over 6 months. Preventive Services / Frequency Ages 19 to 39  Blood  pressure check.** / Every 1 to 2 years.  Lipid and cholesterol check.** / Every 5 years beginning at age 20.  Clinical breast exam.** / Every 3 years for women in their 20s and 30s.  Pap test.** / Every 2 years from ages 21 through 29. Every 3 years starting at age 30 through age 65 or 70 with a history of 3 consecutive normal Pap tests.  HPV screening.** / Every 3 years from ages 30 through ages 65 to 70 with a history of 3 consecutive normal Pap tests.  Hepatitis C blood test.** / For any individual with known risks for hepatitis C.  Skin self-exam. / Monthly.  Influenza immunization.** / Every year.  Pneumococcal polysaccharide immunization.** / 1 to 2 doses if you smoke cigarettes or if you have certain chronic medical conditions.  Tetanus, diphtheria, pertussis (Tdap, Td) immunization. / A one-time dose of Tdap vaccine. After that, you need a Td booster dose every 10 years.  HPV immunization. / 3 doses over 6 months, if you are 26 and younger.  Measles, mumps, rubella (MMR) immunization. / You need at least 1 dose of MMR if you were born in 1957 or later. You may also need a second dose.  Meningococcal immunization. / 1 dose if you are age 19 to 21 and a first-year college student living in a residence hall, or have one of several medical conditions, you need to get vaccinated against meningococcal disease. You may also need additional booster doses.  Varicella immunization.** / Consult your caregiver.  Hepatitis A immunization.** / Consult your caregiver. 2 doses, 6 to 18 months apart.  Hepatitis B immunization.** / Consult your caregiver. 3 doses usually over 6 months. Ages 40 to 64  Blood pressure check.** / Every 1 to 2 years.  Lipid and cholesterol check.** / Every 5 years beginning at age 20.  Clinical breast exam.** / Every year after age 40.  Mammogram.** / Every year beginning at age 40   and continuing for as long as you are in good health. Consult with your  caregiver.  Pap test.** / Every 3 years starting at age 30 through age 65 or 70 with a history of 3 consecutive normal Pap tests.  HPV screening.** / Every 3 years from ages 30 through ages 65 to 70 with a history of 3 consecutive normal Pap tests.  Fecal occult blood test (FOBT) of stool. / Every year beginning at age 50 and continuing until age 75. You may not need to do this test if you get a colonoscopy every 10 years.  Flexible sigmoidoscopy or colonoscopy.** / Every 5 years for a flexible sigmoidoscopy or every 10 years for a colonoscopy beginning at age 50 and continuing until age 75.  Hepatitis C blood test.** / For all people born from 1945 through 1965 and any individual with known risks for hepatitis C.  Skin self-exam. / Monthly.  Influenza immunization.** / Every year.  Pneumococcal polysaccharide immunization.** / 1 to 2 doses if you smoke cigarettes or if you have certain chronic medical conditions.  Tetanus, diphtheria, pertussis (Tdap, Td) immunization.** / A one-time dose of Tdap vaccine. After that, you need a Td booster dose every 10 years.  Measles, mumps, rubella (MMR) immunization. / You need at least 1 dose of MMR if you were born in 1957 or later. You may also need a second dose.  Varicella immunization.** / Consult your caregiver.  Meningococcal immunization.** / Consult your caregiver.  Hepatitis A immunization.** / Consult your caregiver. 2 doses, 6 to 18 months apart.  Hepatitis B immunization.** / Consult your caregiver. 3 doses, usually over 6 months. Ages 65 and over  Blood pressure check.** / Every 1 to 2 years.  Lipid and cholesterol check.** / Every 5 years beginning at age 20.  Clinical breast exam.** / Every year after age 40.  Mammogram.** / Every year beginning at age 40 and continuing for as long as you are in good health. Consult with your caregiver.  Pap test.** / Every 3 years starting at age 30 through age 65 or 70 with a 3  consecutive normal Pap tests. Testing can be stopped between 65 and 70 with 3 consecutive normal Pap tests and no abnormal Pap or HPV tests in the past 10 years.  HPV screening.** / Every 3 years from ages 30 through ages 65 or 70 with a history of 3 consecutive normal Pap tests. Testing can be stopped between 65 and 70 with 3 consecutive normal Pap tests and no abnormal Pap or HPV tests in the past 10 years.  Fecal occult blood test (FOBT) of stool. / Every year beginning at age 50 and continuing until age 75. You may not need to do this test if you get a colonoscopy every 10 years.  Flexible sigmoidoscopy or colonoscopy.** / Every 5 years for a flexible sigmoidoscopy or every 10 years for a colonoscopy beginning at age 50 and continuing until age 75.  Hepatitis C blood test.** / For all people born from 1945 through 1965 and any individual with known risks for hepatitis C.  Osteoporosis screening.** / A one-time screening for women ages 65 and over and women at risk for fractures or osteoporosis.  Skin self-exam. / Monthly.  Influenza immunization.** / Every year.  Pneumococcal polysaccharide immunization.** / 1 dose at age 65 (or older) if you have never been vaccinated.  Tetanus, diphtheria, pertussis (Tdap, Td) immunization. / A one-time dose of Tdap vaccine if you are over   65 and have contact with an infant, are a healthcare worker, or simply want to be protected from whooping cough. After that, you need a Td booster dose every 10 years.  Varicella immunization.** / Consult your caregiver.  Meningococcal immunization.** / Consult your caregiver.  Hepatitis A immunization.** / Consult your caregiver. 2 doses, 6 to 18 months apart.  Hepatitis B immunization.** / Check with your caregiver. 3 doses, usually over 6 months. ** Family history and personal history of risk and conditions may change your caregiver's recommendations. Document Released: 02/14/2001 Document Revised: 03/13/2011  Document Reviewed: 05/16/2010 [employer] Patient Information 2013 [employer].  

## 2012-03-08 LAB — HIV ANTIBODY (ROUTINE TESTING W REFLEX): HIV: NONREACTIVE

## 2012-03-08 LAB — TSH: TSH: 1.108 u[IU]/mL (ref 0.350–4.500)

## 2012-03-08 LAB — HEPATITIS C ANTIBODY: HCV Ab: NEGATIVE

## 2012-03-10 LAB — TESTOSTERONE, FREE, TOTAL, SHBG
Sex Hormone Binding: 21 nmol/L (ref 18–114)
Testosterone: <10 ng/dL — ABNORMAL LOW (ref 10–70)

## 2012-03-10 LAB — DHEA-SULFATE: DHEA-SO4: 102 ug/dL (ref 35–430)

## 2012-03-12 NOTE — Addendum Note (Signed)
Addended by: Jaynie Collins A on: 03/12/2012 02:03 PM   Modules accepted: Level of Service

## 2012-03-15 ENCOUNTER — Telehealth: Payer: Self-pay | Admitting: *Deleted

## 2012-03-15 NOTE — Telephone Encounter (Signed)
Message copied by Arne Cleveland on Fri Mar 15, 2012  8:11 AM ------      Message from: Jaynie Collins A      Created: Thu Mar 14, 2012  5:50 PM       Normal pap and labs.  Please inform patient. ------

## 2012-03-15 NOTE — Telephone Encounter (Signed)
Called pt to inform.

## 2012-03-26 ENCOUNTER — Encounter: Payer: Self-pay | Admitting: Nurse Practitioner

## 2012-03-26 ENCOUNTER — Ambulatory Visit (INDEPENDENT_AMBULATORY_CARE_PROVIDER_SITE_OTHER): Payer: Medicaid Other | Admitting: Nurse Practitioner

## 2012-03-26 VITALS — BP 107/80 | HR 82 | Ht 67.0 in | Wt 194.0 lb

## 2012-03-26 DIAGNOSIS — G43009 Migraine without aura, not intractable, without status migrainosus: Secondary | ICD-10-CM

## 2012-03-26 DIAGNOSIS — G4700 Insomnia, unspecified: Secondary | ICD-10-CM | POA: Insufficient documentation

## 2012-03-26 DIAGNOSIS — E669 Obesity, unspecified: Secondary | ICD-10-CM | POA: Insufficient documentation

## 2012-03-26 MED ORDER — SUMATRIPTAN SUCCINATE 100 MG PO TABS: 100.0000 mg | ORAL_TABLET | Freq: Once | ORAL | Status: DC | PRN

## 2012-03-26 MED ORDER — PROMETHAZINE HCL 25 MG PO TABS
25.0000 mg | ORAL_TABLET | Freq: Four times a day (QID) | ORAL | Status: DC | PRN
Start: 2012-03-26 — End: 2012-09-13

## 2012-03-26 MED ORDER — KETOROLAC TROMETHAMINE 60 MG/2ML IM SOLN
60.0000 mg | Freq: Once | INTRAMUSCULAR | Status: AC
  Administered 2012-03-26: 60 mg via INTRAMUSCULAR

## 2012-03-26 MED ORDER — TOPIRAMATE 25 MG PO TABS: 25.0000 mg | ORAL_TABLET | Freq: Three times a day (TID) | ORAL | Status: DC

## 2012-03-26 NOTE — Addendum Note (Signed)
Addended by: Barbara Cower on: 03/26/2012 04:27 PM   Modules accepted: Orders

## 2012-03-26 NOTE — Patient Instructions (Signed)
Migraine Headache A migraine headache is an intense, throbbing pain on one or both sides of your head. A migraine can last for 30 minutes to several hours. CAUSES  The exact cause of a migraine headache is not always known. However, a migraine may be caused when nerves in the brain become irritated and release chemicals that cause inflammation. This causes pain. SYMPTOMS  Pain on one or both sides of your head.  Pulsating or throbbing pain.  Severe pain that prevents daily activities.  Pain that is aggravated by any physical activity.  Nausea, vomiting, or both.  Dizziness.  Pain with exposure to bright lights, loud noises, or activity.  General sensitivity to bright lights, loud noises, or smells. Before you get a migraine, you may get warning signs that a migraine is coming (aura). An aura may include:  Seeing flashing lights.  Seeing bright spots, halos, or zig-zag lines.  Having tunnel vision or blurred vision.  Having feelings of numbness or tingling.  Having trouble talking.  Having muscle weakness. MIGRAINE TRIGGERS  Alcohol.  Smoking.  Stress.  Menstruation.  Aged cheeses.  Foods or drinks that contain nitrates, glutamate, aspartame, or tyramine.  Lack of sleep.  Chocolate.  Caffeine.  Hunger.  Physical exertion.  Fatigue.  Medicines used to treat chest pain (nitroglycerine), birth control pills, estrogen, and some blood pressure medicines. DIAGNOSIS  A migraine headache is often diagnosed based on:  Symptoms.  Physical examination.  A CT scan or MRI of your head. TREATMENT Medicines may be given for pain and nausea. Medicines can also be given to help prevent recurrent migraines.  HOME CARE INSTRUCTIONS  Only take over-the-counter or prescription medicines for pain or discomfort as directed by your caregiver. The use of long-term narcotics is not recommended.  Lie down in a dark, quiet room when you have a migraine.  Keep a journal  to find out what may trigger your migraine headaches. For example, write down:  What you eat and drink.  How much sleep you get.  Any change to your diet or medicines.  Limit alcohol consumption.  Quit smoking if you smoke.  Get 7 to 9 hours of sleep, or as recommended by your caregiver.  Limit stress.  Keep lights dim if bright lights bother you and make your migraines worse. SEEK IMMEDIATE MEDICAL CARE IF:   Your migraine becomes severe.  You have a fever.  You have a stiff neck.  You have vision loss.  You have muscular weakness or loss of muscle control.  You start losing your balance or have trouble walking.  You feel faint or pass out.  You have severe symptoms that are different from your first symptoms. MAKE SURE YOU:   Understand these instructions.  Will watch your condition.  Will get help right away if you are not doing well or get worse. Document Released: 12/19/2004 Document Revised: 03/13/2011 Document Reviewed: 12/09/2010 [employer] Patient Information 2013 [employer].  

## 2012-03-26 NOTE — Progress Notes (Signed)
Diagnosis: Migraine without Aura  History: Patient has had migraines since she was 27 years old. She was on a preventative then but can not recall the name. Her brother also has migraine. She has taken Excedrin, Aleve, Tylenol # 3 and percocet. She has insomnia which contributes to migraines. She does not fall asleep until 2-3 am and gets up at 4-6 am. 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 She is living with her mother who is ill.  Location: Bilateral temples  Number of Headache days/month: Severe: 8 Moderate:5 Mild:5  Current Outpatient Prescriptions on File Prior to Visit  Medication Sig Dispense Refill  . Multiple Vitamins-Minerals (MULTIVITAMIN WITH MINERALS) tablet Take 1 tablet by mouth daily.      . famotidine (PEPCID) 40 MG tablet Take 1 tablet (40 mg total) by mouth 2 (two) times daily.  28 tablet  0   No current facility-administered medications on file prior to visit.    Acute prevention: None  Past Medical History  Diagnosis Date  . Heart murmur   . Migraines    Past Surgical History  Procedure Laterality Date  . Wisdom tooth extraction     Family History  Problem Relation Age of Onset  . Diabetes Mother   . Hypertension Mother   . Dementia Maternal Grandmother   . Parkinson's disease Maternal Grandmother   . Hypertension Maternal Grandfather    Social History:  reports that she has never smoked. She does not have any smokeless tobacco history on file. She reports that she does not drink alcohol or use illicit drugs. Allergies:  Allergies  Allergen Reactions  . Acetaminophen Shortness Of Breath, Swelling and Rash  . Codeine Shortness Of Breath, Swelling and Rash    Triggers: Stress,  Not getting enough sleep  Birth control: Nexplanon  ROS: Positive for migraine, nausea, insomnia, anxiety, obesity, back pain  Exam: 27 YO AA Female  General: well developed, well nourished, has migraine now HEENT:  negative Cardiac:RRR Lungs:Clear Neuro:Negative Skin:Warm and dry  Impression:migraine - common Insomnia Obesity  Plan: We discussed the pathophysiology of migraine. We agreed she needed prevention at this time and have chosen Topamax to help with migraine, insomnia and hopefully her weight.  She will also be given phenergan/ Imitrex for when she gets a migraine. She has an acute migraine today and will be given Toradol 60 mg IM. She will RTC 6 weeks  Time Spent: 45 min

## 2012-03-26 NOTE — Progress Notes (Signed)
New patient to [facility] for headache consult.  Is having weekly migraines.  Is currently having nausea.

## 2012-04-30 ENCOUNTER — Encounter: Payer: Medicaid Other | Admitting: Nurse Practitioner

## 2012-04-30 DIAGNOSIS — R51 Headache: Secondary | ICD-10-CM

## 2012-05-08 ENCOUNTER — Ambulatory Visit: Payer: Medicaid Other | Admitting: *Deleted

## 2012-09-13 ENCOUNTER — Encounter (HOSPITAL_COMMUNITY): Payer: Self-pay | Admitting: Emergency Medicine

## 2012-09-13 DIAGNOSIS — Z8669 Personal history of other diseases of the nervous system and sense organs: Secondary | ICD-10-CM | POA: Insufficient documentation

## 2012-09-13 DIAGNOSIS — Z3202 Encounter for pregnancy test, result negative: Secondary | ICD-10-CM | POA: Insufficient documentation

## 2012-09-13 DIAGNOSIS — N83209 Unspecified ovarian cyst, unspecified side: Secondary | ICD-10-CM | POA: Insufficient documentation

## 2012-09-13 DIAGNOSIS — Z8679 Personal history of other diseases of the circulatory system: Secondary | ICD-10-CM | POA: Insufficient documentation

## 2012-09-13 LAB — CBC WITH DIFFERENTIAL/PLATELET
Basophils Absolute: 0 10*3/uL (ref 0.0–0.1)
HCT: 35.2 % — ABNORMAL LOW (ref 36.0–46.0)
Lymphocytes Relative: 38 % (ref 12–46)
Lymphs Abs: 3.4 10*3/uL (ref 0.7–4.0)
Monocytes Absolute: 0.6 10*3/uL (ref 0.1–1.0)
Neutro Abs: 4.7 10*3/uL (ref 1.7–7.7)
RBC: 4.52 MIL/uL (ref 3.87–5.11)
RDW: 14.1 % (ref 11.5–15.5)
WBC: 8.8 10*3/uL (ref 4.0–10.5)

## 2012-09-13 LAB — URINE MICROSCOPIC-ADD ON

## 2012-09-13 LAB — COMPREHENSIVE METABOLIC PANEL
ALT: 16 U/L (ref 0–35)
AST: 19 U/L (ref 0–37)
CO2: 22 mEq/L (ref 19–32)
Chloride: 103 mEq/L (ref 96–112)
GFR calc Af Amer: >90 mL/min (ref >90)
GFR calc non Af Amer: >90 mL/min (ref >90)
Glucose, Bld: 99 mg/dL (ref 70–99)
Sodium: 137 mEq/L (ref 135–145)
Total Bilirubin: 0.5 mg/dL (ref 0.3–1.2)

## 2012-09-13 LAB — URINALYSIS, ROUTINE W REFLEX MICROSCOPIC
Hgb urine dipstick: NEGATIVE
Ketones, ur: NEGATIVE mg/dL
Protein, ur: 30 mg/dL — AB
Urobilinogen, UA: 1 mg/dL (ref 0.0–1.0)

## 2012-09-13 NOTE — ED Notes (Signed)
C/o pelvic pain ( L side worse than R) x 3 days.  Denies nausea, vomiting, diarrhea, or urinary complaints.  Pt reports history of ovarian cyst and states it feels like the same pain as before.

## 2012-09-14 ENCOUNTER — Emergency Department (HOSPITAL_COMMUNITY)
Admission: EM | Admit: 2012-09-14 | Discharge: 2012-09-14 | Disposition: A | Discharge status: Home / Self Care | Payer: Medicaid Other | Attending: Emergency Medicine | Admitting: Emergency Medicine

## 2012-09-14 ENCOUNTER — Emergency Department (HOSPITAL_COMMUNITY): Payer: Medicaid Other

## 2012-09-14 DIAGNOSIS — N83209 Unspecified ovarian cyst, unspecified side: Secondary | ICD-10-CM

## 2012-09-14 DIAGNOSIS — R109 Unspecified abdominal pain: Secondary | ICD-10-CM

## 2012-09-14 HISTORY — DX: Migraine, unspecified, not intractable, without status migrainosus: G43.909

## 2012-09-14 LAB — RPR: RPR Ser Ql: NONREACTIVE

## 2012-09-14 LAB — POCT PREGNANCY, URINE: Preg Test, Ur: NEGATIVE

## 2012-09-14 MED ORDER — ONDANSETRON HCL 4 MG PO TABS: 4.0000 mg | ORAL_TABLET | Freq: Four times a day (QID) | ORAL | Status: DC

## 2012-09-14 MED ORDER — OXYCODONE HCL 5 MG PO TABS
10.0000 mg | ORAL_TABLET | Freq: Once | ORAL | Status: AC
  Administered 2012-09-14: 10 mg via ORAL
  Filled 2012-09-14: qty 2

## 2012-09-14 MED ORDER — TRAMADOL HCL 50 MG PO TABS: 50.0000 mg | ORAL_TABLET | Freq: Four times a day (QID) | ORAL | Status: DC | PRN

## 2012-09-14 NOTE — ED Notes (Signed)
Pt "ready to go", Dr. Lavella Lemons into room to explain results and plan, pt alert, NAD, calm, interactive, dressed self, given Rx x2, out with family, steady gait, declined w/c.

## 2012-09-14 NOTE — ED Notes (Signed)
Pelvic exam cancelled.

## 2012-09-14 NOTE — ED Notes (Signed)
Dr. Lavella Lemons in to speak with pt, pt updated, pain med given.

## 2012-09-14 NOTE — ED Notes (Signed)
Pt alert, NAD, calm, interactive, resps e/u, speaking in clear complete sentences, family sleeping at [facility], pt sleeping/ resting with eyes closed, c/o pelvic pain, denies other sx.

## 2012-09-14 NOTE — ED Provider Notes (Addendum)
CSN: [id number]     Arrival date & time 09/13/12  2155 History   First MD Initiated Contact with Patient 09/14/12 0201     Chief Complaint  Patient presents with  . Abdominal Pain   (Consider location/radiation/quality/duration/timing/severity/associated sxs/prior Treatment) HPI Patient is a 27 yo woman with a history of ovarian cysts and rupture of same. She is here with 3d of bilateral lower abdominal pain - left > right. Sx began suddenly 3d ago. Pain is constant. She has been using a heating pad with a little relieve. No excacerbating sx. She has had intermittent nausea. No vomiting. No fever.   Pain is severe, aching, throbbing. Patient denies vaginal bleeding and unusual discharge.   She notes that sx are similar to those which she has experienced with previous dx of ruptured ovarian cyst. Patient does not have a gynecologist.   Past Medical History  Diagnosis Date  . Heart murmur   . Migraines   . Migraine    Past Surgical History  Procedure Laterality Date  . Wisdom tooth extraction     Family History  Problem Relation Age of Onset  . Diabetes Mother   . Hypertension Mother   . Dementia Maternal Grandmother   . Parkinson's disease Maternal Grandmother   . Hypertension Maternal Grandfather    History  Substance Use Topics  . Smoking status: Never Smoker   . Smokeless tobacco: Not on file  . Alcohol Use: No   OB History   Grav Para Term Preterm Abortions TAB SAB Ect Mult Living   1 1  1      1      Review of Systems 10 point ROS obtained and is negative with the exception of sx noted above.   Allergies  Acetaminophen and Codeine  Home Medications   Current Outpatient Rx  Name  Route  Sig  Dispense  Refill  . etonogestrel (NEXPLANON) 68 MG IMPL implant   Subcutaneous   Inject 1 each into the skin once.         . topiramate (TOPAMAX) 25 MG tablet   Oral   Take 25 mg by mouth 3 (three) times daily as needed. For migraines         . SUMAtriptan  (IMITREX) 100 MG tablet   Oral   Take 1 tablet (100 mg total) by mouth once as needed for migraine.   9 tablet   11    BP 116/68  Pulse 72  Temp(Src) 98 F (36.7 C) (Oral)  Resp 18  Wt 216 lb (97.977 kg)  BMI 33.82 kg/m2  SpO2 97%  LMP 06/10/2012 Physical Exam Gen: well nourished, nontoxic appearing, well developed Head: NCAT Eyes: PERL Mouth: oral mucosa is well hydrated appearing Neck: supple Resp: RR 20/min, CTA bilat CV: RRR, no murmur, extremities well perfused Abd: soft, tender over the LLQ, no peritoneal signs, nondistended Skin: no lesions Ext: no edema, normal to inspection Back: normal to inspection, no CVA tp Neuro: no focal neurologic deficits, cn ii-xii grossly intact.    ED Course  Procedures (including critical care time)  Results for orders placed during the hospital encounter of 09/14/12 (from the past 24 hour(s))  URINALYSIS, ROUTINE W REFLEX MICROSCOPIC     Status: Abnormal   Collection Time    09/13/12 10:11 PM      Result Value Range   Color, Urine YELLOW  YELLOW   APPearance CLEAR  CLEAR   Specific Gravity, Urine 1.039 (*) 1.005 - 1.030  pH 5.5  5.0 - 8.0   Glucose, UA NEGATIVE  NEGATIVE mg/dL   Hgb urine dipstick NEGATIVE  NEGATIVE   Bilirubin Urine SMALL (*) NEGATIVE   Ketones, ur NEGATIVE  NEGATIVE mg/dL   Protein, ur 30 (*) NEGATIVE mg/dL   Urobilinogen, UA 1.0  0.0 - 1.0 mg/dL   Nitrite NEGATIVE  NEGATIVE   Leukocytes, UA NEGATIVE  NEGATIVE  URINE MICROSCOPIC-ADD ON     Status: Abnormal   Collection Time    09/13/12 10:11 PM      Result Value Range   Squamous Epithelial / LPF MANY (*) RARE   WBC, UA 3-6  <3 WBC/hpf   RBC / HPF 0-2  <3 RBC/hpf   Bacteria, UA RARE  RARE   Urine-Other MUCOUS PRESENT    CBC WITH DIFFERENTIAL     Status: Abnormal   Collection Time    09/13/12 10:15 PM      Result Value Range   WBC 8.8  4.0 - 10.5 K/uL   RBC 4.52  3.87 - 5.11 MIL/uL   Hemoglobin 12.1  12.0 - 15.0 g/dL   HCT 16.1 (*) 09.6 - 04.5  %   MCV 77.9 (*) 78.0 - 100.0 fL   MCH 26.8  26.0 - 34.0 pg   MCHC 34.4  30.0 - 36.0 g/dL   RDW 40.9  81.1 - 91.4 %   Platelets 211  150 - 400 K/uL   Neutrophils Relative % 54  43 - 77 %   Neutro Abs 4.7  1.7 - 7.7 K/uL   Lymphocytes Relative 38  12 - 46 %   Lymphs Abs 3.4  0.7 - 4.0 K/uL   Monocytes Relative 7  3 - 12 %   Monocytes Absolute 0.6  0.1 - 1.0 K/uL   Eosinophils Relative 1  0 - 5 %   Eosinophils Absolute 0.1  0.0 - 0.7 K/uL   Basophils Relative 0  0 - 1 %   Basophils Absolute 0.0  0.0 - 0.1 K/uL  COMPREHENSIVE METABOLIC PANEL     Status: None   Collection Time    09/13/12 10:15 PM      Result Value Range   Sodium 137  135 - 145 mEq/L   Potassium 3.8  3.5 - 5.1 mEq/L   Chloride 103  96 - 112 mEq/L   CO2 22  19 - 32 mEq/L   Glucose, Bld 99  70 - 99 mg/dL   BUN 17  6 - 23 mg/dL   Creatinine, Ser 7.82  0.50 - 1.10 mg/dL   Calcium 9.6  8.4 - 95.6 mg/dL   Total Protein 7.4  6.0 - 8.3 g/dL   Albumin 4.0  3.5 - 5.2 g/dL   AST 19  0 - 37 U/L   ALT 16  0 - 35 U/L   Alkaline Phosphatase 81  39 - 117 U/L   Total Bilirubin 0.5  0.3 - 1.2 mg/dL   GFR calc non Af Amer >90  >90 mL/min   GFR calc Af Amer >90  >90 mL/min  POCT PREGNANCY, URINE     Status: None   Collection Time    09/13/12 10:25 PM      Result Value Range   Preg Test, Ur NEGATIVE  NEGATIVE       US Transvaginal Non-OB (Final result)  Result time: 09/14/12 05:49:18    Final result by Rad Results In Interface (09/14/12 05:49:18)    Narrative:   CLINICAL DATA: Abdominal  pain  EXAM: TRANSABDOMINAL AND TRANSVAGINAL ULTRASOUND OF PELVIS  TECHNIQUE: Both transabdominal and transvaginal ultrasound examinations of the pelvis were performed. Transabdominal technique was performed for global imaging of the pelvis including uterus, ovaries, adnexal regions, and pelvic cul-de-sac. It was necessary to proceed with endovaginal exam following the transabdominal exam to visualize the endometrium and  ovaries.  COMPARISON: 11/15/2005  FINDINGS: Uterus  Measurements: 8 x 4 x 5 cm No mass.  Endometrium  Thickness: 5 mm. No focal abnormality visualized.  Right ovary  Measurements: 2.7 x 1.7 x 2.5 cm. Normal appearance/no adnexal mass.  Left ovary  Measurements: 3.9 x 2.6 x 3.7 cm, enlarged secondary to a 3.2 cm diameter cyst which appears simple. The surrounding parenchyma does not appear edematous and there is color Doppler flow.  Other findings  No free fluid.  IMPRESSION: 1. No acute pelvic abnormality.  2. 3-cm left ovarian cyst.     MDM   Ddx: uti, ovarian cyst, ovarian torsion, endometriosis, uterine fibroid, functional abdominal pain, colitis, ectopic pregnancy, threatened abortion.   Labs are all reassuring. Patient has mild anemia. Urine preg neg and u/a wnl. We will perform pelvic exam to assess for PID and obtain pelvic U/S to rule out ovarian torsion. We will manage symptomatically at present.    Brandt Loosen, MD 09/14/12 1610  Brandt Loosen, MD 10/04/12 [phone]

## 2012-09-14 NOTE — ED Notes (Signed)
Pt in [place]

## 2012-11-05 ENCOUNTER — Emergency Department (INDEPENDENT_AMBULATORY_CARE_PROVIDER_SITE_OTHER)
Admission: EM | Admit: 2012-11-05 | Discharge: 2012-11-05 | Disposition: A | Discharge status: Home / Self Care | Payer: Medicaid Other | Source: Home / Self Care | Attending: Family Medicine | Admitting: Family Medicine

## 2012-11-05 ENCOUNTER — Encounter (HOSPITAL_COMMUNITY): Payer: Self-pay | Admitting: Emergency Medicine

## 2012-11-05 DIAGNOSIS — B9789 Other viral agents as the cause of diseases classified elsewhere: Secondary | ICD-10-CM

## 2012-11-05 DIAGNOSIS — B349 Viral infection, unspecified: Secondary | ICD-10-CM

## 2012-11-05 MED ORDER — IBUPROFEN 800 MG PO TABS: 800.0000 mg | ORAL_TABLET | Freq: Three times a day (TID) | ORAL | Status: DC

## 2012-11-05 MED ORDER — HYDROCODONE-HOMATROPINE 5-1.5 MG/5ML PO SYRP: 5.0000 mL | ORAL_SOLUTION | Freq: Four times a day (QID) | ORAL | Status: DC | PRN

## 2012-11-05 NOTE — ED Provider Notes (Signed)
CSN: [id number]     Arrival date & time 11/05/12  1727 History   First MD Initiated Contact with Patient 11/05/12 1817     Chief Complaint  Patient presents with  . Influenza   (Consider location/radiation/quality/duration/timing/severity/associated sxs/prior Treatment) Patient is a 27 y.o. female presenting with flu symptoms. The history is provided by the patient. No language interpreter was used.  Influenza Presenting symptoms: cough, fever, headache, myalgias and nausea   Severity:  Moderate Onset quality:  Gradual Duration:  2 days Progression:  Worsening Chronicity:  New Relieved by:  Nothing Worsened by:  Nothing tried Ineffective treatments:  None tried Associated symptoms: chills, decreased appetite and neck stiffness     Past Medical History  Diagnosis Date  . Heart murmur   . Migraines   . Migraine    Past Surgical History  Procedure Laterality Date  . Wisdom tooth extraction     Family History  Problem Relation Age of Onset  . Diabetes Mother   . Hypertension Mother   . Dementia Maternal Grandmother   . Parkinson's disease Maternal Grandmother   . Hypertension Maternal Grandfather    History  Substance Use Topics  . Smoking status: Never Smoker   . Smokeless tobacco: Not on file  . Alcohol Use: Yes   OB History   Grav Para Term Preterm Abortions TAB SAB Ect Mult Living   1 1  1      1      Review of Systems  Constitutional: Positive for fever, chills and decreased appetite.  Respiratory: Positive for cough.   Gastrointestinal: Positive for nausea.  Musculoskeletal: Positive for myalgias and neck stiffness.  Neurological: Positive for headaches.  All other systems reviewed and are negative.    Allergies  Acetaminophen and Codeine  Home Medications   Current Outpatient Rx  Name  Route  Sig  Dispense  Refill  . etonogestrel (NEXPLANON) 68 MG IMPL implant   Subcutaneous   Inject 1 each into the skin once.         . ondansetron (ZOFRAN)  4 MG tablet   Oral   Take 1 tablet (4 mg total) by mouth every 6 (six) hours.   12 tablet   0   . SUMAtriptan (IMITREX) 100 MG tablet   Oral   Take 1 tablet (100 mg total) by mouth once as needed for migraine.   9 tablet   11   . topiramate (TOPAMAX) 25 MG tablet   Oral   Take 25 mg by mouth 3 (three) times daily as needed. For migraines         . traMADol (ULTRAM) 50 MG tablet   Oral   Take 1 tablet (50 mg total) by mouth every 6 (six) hours as needed for pain.   15 tablet   0    BP 125/74  Pulse 99  Temp(Src) 99.3 F (37.4 C) (Oral)  Resp 18  SpO2 100% Physical Exam  Nursing note and vitals reviewed. Constitutional: She appears well-developed and well-nourished.  HENT:  Head: Normocephalic.  Right Ear: External ear normal.  Left Ear: External ear normal.  Nose: Nose normal.  Mouth/Throat: Oropharynx is clear and moist.  Eyes: Conjunctivae are normal. Pupils are equal, round, and reactive to light.  Neck: Normal range of motion. Neck supple.  Cardiovascular: Normal rate and normal heart sounds.   Pulmonary/Chest: Effort normal and breath sounds normal.  Abdominal: Soft.  Musculoskeletal: Normal range of motion.  Neurological: She is alert.  Skin: Skin  is warm.  Psychiatric: She has a normal mood and affect.    ED Course  Procedures (including critical care time) Labs Review Labs Reviewed - No data to display Imaging Review No results found.  EKG Interpretation     Ventricular Rate:    PR Interval:    QRS Duration:   QT Interval:    QTC Calculation:   R Axis:     Text Interpretation:              MDM   1. Viral disease     Pt given rx for hycodan and ibuprofen.       Elson Areas, PA-C 11/05/12 1904

## 2012-11-05 NOTE — ED Notes (Signed)
C/o fever, fatigue, productive cough with clear sputum, congestion, sore throat and some swelling, dizziness and nausea.   Pt has used mucinex  And alkalizer  For symptoms with mild relief in chest tightness and congestion.   Onset of symptoms. Was 11/03/12

## 2012-11-06 NOTE — ED Provider Notes (Signed)
Medical screening examination/treatment/procedure(s) were performed by resident physician or non-physician practitioner and as supervising physician I was immediately available for consultation/collaboration.   Nabria Nevin DOUGLAS MD.   Levonte Molina D Terre Zabriskie, MD 11/06/12 2006 

## 2013-01-27 ENCOUNTER — Emergency Department (HOSPITAL_COMMUNITY)
Admission: EM | Admit: 2013-01-27 | Discharge: 2013-01-27 | Disposition: A | Discharge status: Home / Self Care | Payer: Medicaid Other | Attending: Emergency Medicine | Admitting: Emergency Medicine

## 2013-01-27 ENCOUNTER — Encounter (HOSPITAL_COMMUNITY): Payer: Self-pay | Admitting: Emergency Medicine

## 2013-01-27 DIAGNOSIS — J329 Chronic sinusitis, unspecified: Secondary | ICD-10-CM

## 2013-01-27 DIAGNOSIS — Z8679 Personal history of other diseases of the circulatory system: Secondary | ICD-10-CM | POA: Insufficient documentation

## 2013-01-27 DIAGNOSIS — R011 Cardiac murmur, unspecified: Secondary | ICD-10-CM | POA: Insufficient documentation

## 2013-01-27 MED ORDER — AMOXICILLIN-POT CLAVULANATE 875-125 MG PO TABS: 1.0000 | ORAL_TABLET | Freq: Two times a day (BID) | ORAL | Status: DC

## 2013-01-27 MED ORDER — DIPHENHYDRAMINE HCL 25 MG PO TABS: 25.0000 mg | ORAL_TABLET | Freq: Four times a day (QID) | ORAL | Status: DC | PRN

## 2013-01-27 NOTE — ED Notes (Signed)
Reports sinus pressure, pain behind ears, and nasal congestion

## 2013-01-27 NOTE — ED Provider Notes (Signed)
CSN: [id number]     Arrival date & time 01/27/13  1703 History  This chart was scribed for non-physician practitioner, Emilia Beck, PA-C working with Gavin Pound. Oletta Lamas, MD by Greggory Stallion, ED scribe. This patient was seen in room TR04C/TR04C and the patient's care was started at 6:50 PM.   Chief Complaint  Patient presents with  . Facial Pain   The history is provided by the patient. No language interpreter was used.   HPI Comments: Julie [name] is a 28 y.o. female who presents to the Emergency Department complaining of gradual onset, constant sinus pressure and nasal congestion that started 3 days ago. Pt also has intermittent fever of 101 and nonproductive cough. She has taken alka seltzer and Excedrin with no relief.   Past Medical History  Diagnosis Date  . Heart murmur   . Migraines   . Migraine    Past Surgical History  Procedure Laterality Date  . Wisdom tooth extraction     Family History  Problem Relation Age of Onset  . Diabetes Mother   . Hypertension Mother   . Dementia Maternal Grandmother   . Parkinson's disease Maternal Grandmother   . Hypertension Maternal Grandfather    History  Substance Use Topics  . Smoking status: Never Smoker   . Smokeless tobacco: Not on file  . Alcohol Use: Yes   OB History   Grav Para Term Preterm Abortions TAB SAB Ect Mult Living   1 1  1      1      Review of Systems  Constitutional: Positive for fever.  HENT: Positive for congestion and sinus pressure.   Respiratory: Positive for cough.   All other systems reviewed and are negative.    Allergies  Acetaminophen and Codeine  Home Medications  No current outpatient prescriptions on file.  BP 125/80  Pulse 83  Temp(Src) 97.8 F (36.6 C) (Oral)  SpO2 100%  LMP 11/22/2012  Physical Exam  Nursing note and vitals reviewed. Constitutional: She is oriented to person, place, and time. She appears well-developed and well-nourished. No distress.  HENT:  Head:  Normocephalic and atraumatic.  Nose: Right sinus exhibits maxillary sinus tenderness and frontal sinus tenderness. Left sinus exhibits maxillary sinus tenderness and frontal sinus tenderness.  Eyes: EOM are normal.  Neck: Neck supple. No tracheal deviation present.  Cardiovascular: Normal rate, regular rhythm and normal heart sounds.   Pulmonary/Chest: Effort normal and breath sounds normal. No respiratory distress. She has no wheezes. She has no rales.  Musculoskeletal: Normal range of motion.  Neurological: She is alert and oriented to person, place, and time.  Skin: Skin is warm and dry.  Psychiatric: She has a normal mood and affect. Her behavior is normal.    ED Course  Procedures (including critical care time)  DIAGNOSTIC STUDIES: Oxygen Saturation is 100% on RA, normal by my interpretation.    COORDINATION OF CARE: 6:52 PM-Discussed treatment plan which includes an antibiotic and benadryl with pt at bedside and pt agreed to plan.   Labs Review Labs Reviewed - No data to display Imaging Review No results found.  EKG Interpretation   None       MDM   1. Sinusitis     6:56 PM Patient likely has sinusitis. Patient will be discharged with amoxicillin and benadryl. Vitals stable and patient afebrile.   I personally performed the services described in this documentation, which was scribed in my presence. The recorded information has been reviewed and is accurate.  Emilia BeckKaitlyn Angelisse Riso, [place] JerseyPA-C 01/27/13 1857

## 2013-01-27 NOTE — Discharge Instructions (Signed)
Take Augmentin as directed until gone. Take benadryl as needed for congestion. Refer to attached documents for more information.

## 2013-01-28 NOTE — ED Provider Notes (Signed)
Medical screening examination/treatment/procedure(s) were performed by non-physician practitioner and as supervising physician I was immediately available for consultation/collaboration.  EKG Interpretation   None         Ericah Scotto Y. Sophiya Morello, MD 01/28/13 2057 

## 2013-02-12 ENCOUNTER — Encounter: Payer: Self-pay | Admitting: Obstetrics and Gynecology

## 2013-02-12 ENCOUNTER — Other Ambulatory Visit (HOSPITAL_COMMUNITY)
Admission: RE | Admit: 2013-02-12 | Discharge: 2013-02-12 | Disposition: A | Discharge status: Home / Self Care | Payer: Medicaid Other | Source: Ambulatory Visit | Attending: Obstetrics and Gynecology | Admitting: Obstetrics and Gynecology

## 2013-02-12 ENCOUNTER — Ambulatory Visit (INDEPENDENT_AMBULATORY_CARE_PROVIDER_SITE_OTHER): Payer: Medicaid Other | Admitting: Obstetrics and Gynecology

## 2013-02-12 VITALS — BP 132/84 | HR 69 | Ht 67.0 in | Wt 215.0 lb

## 2013-02-12 DIAGNOSIS — Z01419 Encounter for gynecological examination (general) (routine) without abnormal findings: Secondary | ICD-10-CM

## 2013-02-12 DIAGNOSIS — Z3046 Encounter for surveillance of implantable subdermal contraceptive: Secondary | ICD-10-CM

## 2013-02-12 DIAGNOSIS — Z113 Encounter for screening for infections with a predominantly sexual mode of transmission: Secondary | ICD-10-CM

## 2013-02-12 DIAGNOSIS — E669 Obesity, unspecified: Secondary | ICD-10-CM

## 2013-02-12 DIAGNOSIS — Z202 Contact with and (suspected) exposure to infections with a predominantly sexual mode of transmission: Secondary | ICD-10-CM

## 2013-02-12 MED ORDER — AZITHROMYCIN 500 MG PO TABS: 250.0000 mg | ORAL_TABLET | Freq: Once | ORAL | Status: DC

## 2013-02-12 NOTE — Patient Instructions (Signed)
Preventive Care for Adults, Female A healthy lifestyle and preventive care can promote health and wellness. Preventive health guidelines for women include the following key practices.  A routine yearly physical is a good way to check with your health care provider about your health and preventive screening. It is a chance to share any concerns and updates on your health and to receive a thorough exam.  Visit your dentist for a routine exam and preventive care every 6 months. Brush your teeth twice a day and floss once a day. Good oral hygiene prevents tooth decay and gum disease.  The frequency of eye exams is based on your age, health, family medical history, use of contact lenses, and other factors. Follow your health care provider's recommendations for frequency of eye exams.  Eat a healthy diet. Foods like vegetables, fruits, whole grains, low-fat dairy products, and lean protein foods contain the nutrients you need without too many calories. Decrease your intake of foods high in solid fats, added sugars, and salt. Eat the right amount of calories for you.Get information about a proper diet from your health care provider, if necessary.  Regular physical exercise is one of the most important things you can do for your health. Most adults should get at least 150 minutes of moderate-intensity exercise (any activity that increases your heart rate and causes you to sweat) each week. In addition, most adults need muscle-strengthening exercises on 2 or more days a week.  Maintain a healthy weight. The body mass index (BMI) is a screening tool to identify possible weight problems. It provides an estimate of body fat based on height and weight. Your health care provider can find your BMI, and can help you achieve or maintain a healthy weight.For adults 20 years and older:  A BMI below 18.5 is considered underweight.  A BMI of 18.5 to 24.9 is normal.  A BMI of 25 to 29.9 is considered  overweight.  A BMI of 30 and above is considered obese.  Maintain normal blood lipids and cholesterol levels by exercising and minimizing your intake of saturated fat. Eat a balanced diet with plenty of fruit and vegetables. Blood tests for lipids and cholesterol should begin at age 20 and be repeated every 5 years. If your lipid or cholesterol levels are high, you are over 50, or you are at high risk for heart disease, you may need your cholesterol levels checked more frequently.Ongoing high lipid and cholesterol levels should be treated with medicines if diet and exercise are not working.  If you smoke, find out from your health care provider how to quit. If you do not use tobacco, do not start.  Lung cancer screening is recommended for adults aged 55 80 years who are at high risk for developing lung cancer because of a history of smoking. A yearly low-dose CT scan of the lungs is recommended for people who have at least a 30-pack-year history of smoking and are a current smoker or have quit within the past 15 years. A pack year of smoking is smoking an average of 1 pack of cigarettes a day for 1 year (for example: 1 pack a day for 30 years or 2 packs a day for 15 years). Yearly screening should continue until the smoker has stopped smoking for at least 15 years. Yearly screening should be stopped for people who develop a health problem that would prevent them from having lung cancer treatment.  If you are pregnant, do not drink alcohol. If you   are breastfeeding, be very cautious about drinking alcohol. If you are not pregnant and choose to drink alcohol, do not have more than 1 drink per day. One drink is considered to be 12 ounces (355 mL) of beer, 5 ounces (148 mL) of wine, or 1.5 ounces (44 mL) of liquor.  Avoid use of street drugs. Do not share needles with anyone. Ask for help if you need support or instructions about stopping the use of drugs.  High blood pressure causes heart disease and  increases the risk of stroke. Your blood pressure should be checked at least every 1 to 2 years. Ongoing high blood pressure should be treated with medicines if weight loss and exercise do not work.  If you are 20 26 years old, ask your health care provider if you should take aspirin to prevent strokes.  Diabetes screening involves taking a blood sample to check your fasting blood sugar level. This should be done once every 3 years, after age 35, if you are within normal weight and without risk factors for diabetes. Testing should be considered at a younger age or be carried out more frequently if you are overweight and have at least 1 risk factor for diabetes.  Breast cancer screening is essential preventive care for women. You should practice "breast self-awareness." This means understanding the normal appearance and feel of your breasts and may include breast self-examination. Any changes detected, no matter how small, should be reported to a health care provider. Women in their 42s and 30s should have a clinical breast exam (CBE) by a health care provider as part of a regular health exam every 1 to 3 years. After age 74, women should have a CBE every year. Starting at age 43, women should consider having a mammogram (breast X-ray test) every year. Women who have a family history of breast cancer should talk to their health care provider about genetic screening. Women at a high risk of breast cancer should talk to their health care providers about having an MRI and a mammogram every year.  Breast cancer gene (BRCA)-related cancer risk assessment is recommended for women who have family members with BRCA-related cancers. BRCA-related cancers include breast, ovarian, tubal, and peritoneal cancers. Having family members with these cancers may be associated with an increased risk for harmful changes (mutations) in the breast cancer genes BRCA1 and BRCA2. Results of the assessment will determine the need for  genetic counseling and BRCA1 and BRCA2 testing.  The Pap test is a screening test for cervical cancer. A Pap test can show cell changes on the cervix that might become cervical cancer if left untreated. A Pap test is a procedure in which cells are obtained and examined from the lower end of the uterus (cervix).  Women should have a Pap test starting at age 60.  Between ages 63 and 62, Pap tests should be repeated every 2 years.  Beginning at age 43, you should have a Pap test every 3 years as long as the past 3 Pap tests have been normal.  Some women have medical problems that increase the chance of getting cervical cancer. Talk to your health care provider about these problems. It is especially important to talk to your health care provider if a new problem develops soon after your last Pap test. In these cases, your health care provider may recommend more frequent screening and Pap tests.  The above recommendations are the same for women who have or have not gotten the vaccine  for human papillomavirus (HPV).  If you had a hysterectomy for a problem that was not cancer or a condition that could lead to cancer, then you no longer need Pap tests. Even if you no longer need a Pap test, a regular exam is a good idea to make sure no other problems are starting.  If you are between ages 65 and 70 years, and you have had normal Pap tests going back 10 years, you no longer need Pap tests. Even if you no longer need a Pap test, a regular exam is a good idea to make sure no other problems are starting.  If you have had past treatment for cervical cancer or a condition that could lead to cancer, you need Pap tests and screening for cancer for at least 20 years after your treatment.  If Pap tests have been discontinued, risk factors (such as a new sexual partner) need to be reassessed to determine if screening should be resumed.  The HPV test is an additional test that may be used for cervical cancer  screening. The HPV test looks for the virus that can cause the cell changes on the cervix. The cells collected during the Pap test can be tested for HPV. The HPV test could be used to screen women aged 30 years and older, and should be used in women of any age who have unclear Pap test results. After the age of 30, women should have HPV testing at the same frequency as a Pap test.  Colorectal cancer can be detected and often prevented. Most routine colorectal cancer screening begins at the age of 50 years and continues through age 75 years. However, your health care provider may recommend screening at an earlier age if you have risk factors for colon cancer. On a yearly basis, your health care provider may provide home test kits to check for hidden blood in the stool. Use of a small camera at the end of a tube, to directly examine the colon (sigmoidoscopy or colonoscopy), can detect the earliest forms of colorectal cancer. Talk to your health care provider about this at age 50, when routine screening begins. Direct exam of the colon should be repeated every 5 10 years through age 75 years, unless early forms of pre-cancerous polyps or small growths are found.  People who are at an increased risk for hepatitis B should be screened for this virus. You are considered at high risk for hepatitis B if:  You were born in a country where hepatitis B occurs often. Talk with your health care provider about which countries are considered high risk.  Your parents were born in a high-risk country and you have not received a shot to protect against hepatitis B (hepatitis B vaccine).  You have HIV or AIDS.  You use needles to inject street drugs.  You live with, or have sex with, someone who has Hepatitis B.  You get hemodialysis treatment.  You take certain medicines for conditions like cancer, organ transplantation, and autoimmune conditions.  Hepatitis C blood testing is recommended for all people born from  1945 through 1965 and any individual with known risks for hepatitis C.  Practice safe sex. Use condoms and avoid high-risk sexual practices to reduce the spread of sexually transmitted infections (STIs). STIs include gonorrhea, chlamydia, syphilis, trichomonas, herpes, HPV, and human immunodeficiency virus (HIV). Herpes, HIV, and HPV are viral illnesses that have no cure. They can result in disability, cancer, and death. Sexually active women aged 25   years and younger should be checked for chlamydia. Older women with new or multiple partners should also be tested for chlamydia. Testing for other STIs is recommended if you are sexually active and at increased risk.  Osteoporosis is a disease in which the bones lose minerals and strength with aging. This can result in serious bone fractures or breaks. The risk of osteoporosis can be identified using a bone density scan. Women ages 65 years and over and women at risk for fractures or osteoporosis should discuss screening with their health care providers. Ask your health care provider whether you should take a calcium supplement or vitamin D to reduce the rate of osteoporosis.  Menopause can be associated with physical symptoms and risks. Hormone replacement therapy is available to decrease symptoms and risks. You should talk to your health care provider about whether hormone replacement therapy is right for you.  Use sunscreen. Apply sunscreen liberally and repeatedly throughout the day. You should seek shade when your shadow is shorter than you. Protect yourself by wearing long sleeves, pants, a wide-brimmed hat, and sunglasses year round, whenever you are outdoors.  Once a month, do a whole body skin exam, using a mirror to look at the skin on your back. Tell your health care provider of new moles, moles that have irregular borders, moles that are larger than a pencil eraser, or moles that have changed in shape or color.  Stay current with required  vaccines (immunizations).  Influenza vaccine. All adults should be immunized every year.  Tetanus, diphtheria, and acellular pertussis (Td, Tdap) vaccine. Pregnant women should receive 1 dose of Tdap vaccine during each pregnancy. The dose should be obtained regardless of the length of time since the last dose. Immunization is preferred during the 27th 36th week of gestation. An adult who has not previously received Tdap or who does not know her vaccine status should receive 1 dose of Tdap. This initial dose should be followed by tetanus and diphtheria toxoids (Td) booster doses every 10 years. Adults with an unknown or incomplete history of completing a 3-dose immunization series with Td-containing vaccines should begin or complete a primary immunization series including a Tdap dose. Adults should receive a Td booster every 10 years.  Varicella vaccine. An adult without evidence of immunity to varicella should receive 2 doses or a second dose if she has previously received 1 dose. Pregnant females who do not have evidence of immunity should receive the first dose after pregnancy. This first dose should be obtained before leaving the health care facility. The second dose should be obtained 4 8 weeks after the first dose.  Human papillomavirus (HPV) vaccine. Females aged 13 26 years who have not received the vaccine previously should obtain the 3-dose series. The vaccine is not recommended for use in pregnant females. However, pregnancy testing is not needed before receiving a dose. If a female is found to be pregnant after receiving a dose, no treatment is needed. In that case, the remaining doses should be delayed until after the pregnancy. Immunization is recommended for any person with an immunocompromised condition through the age of 26 years if she did not get any or all doses earlier. During the 3-dose series, the second dose should be obtained 4 8 weeks after the first dose. The third dose should be  obtained 24 weeks after the first dose and 16 weeks after the second dose.  Zoster vaccine. One dose is recommended for adults aged 60 years or older unless certain   conditions are present.  Measles, mumps, and rubella (MMR) vaccine. Adults born before 1957 generally are considered immune to measles and mumps. Adults born in 1957 or later should have 1 or more doses of MMR vaccine unless there is a contraindication to the vaccine or there is laboratory evidence of immunity to each of the three diseases. A routine second dose of MMR vaccine should be obtained at least 28 days after the first dose for students attending postsecondary schools, health care workers, or international travelers. People who received inactivated measles vaccine or an unknown type of measles vaccine during 1963 1967 should receive 2 doses of MMR vaccine. People who received inactivated mumps vaccine or an unknown type of mumps vaccine before 1979 and are at high risk for mumps infection should consider immunization with 2 doses of MMR vaccine. For females of childbearing age, rubella immunity should be determined. If there is no evidence of immunity, females who are not pregnant should be vaccinated. If there is no evidence of immunity, females who are pregnant should delay immunization until after pregnancy. Unvaccinated health care workers born before 1957 who lack laboratory evidence of measles, mumps, or rubella immunity or laboratory confirmation of disease should consider measles and mumps immunization with 2 doses of MMR vaccine or rubella immunization with 1 dose of MMR vaccine.  Pneumococcal 13-valent conjugate (PCV13) vaccine. When indicated, a person who is uncertain of her immunization history and has no record of immunization should receive the PCV13 vaccine. An adult aged 19 years or older who has certain medical conditions and has not been previously immunized should receive 1 dose of PCV13 vaccine. This PCV13 should be  followed with a dose of pneumococcal polysaccharide (PPSV23) vaccine. The PPSV23 vaccine dose should be obtained at least 8 weeks after the dose of PCV13 vaccine. An adult aged 19 years or older who has certain medical conditions and previously received 1 or more doses of PPSV23 vaccine should receive 1 dose of PCV13. The PCV13 vaccine dose should be obtained 1 or more years after the last PPSV23 vaccine dose.  Pneumococcal polysaccharide (PPSV23) vaccine. When PCV13 is also indicated, PCV13 should be obtained first. All adults aged 65 years and older should be immunized. An adult younger than age 65 years who has certain medical conditions should be immunized. Any person who resides in a nursing home or long-term care facility should be immunized. An adult smoker should be immunized. People with an immunocompromised condition and certain other conditions should receive both PCV13 and PPSV23 vaccines. People with human immunodeficiency virus (HIV) infection should be immunized as soon as possible after diagnosis. Immunization during chemotherapy or radiation therapy should be avoided. Routine use of PPSV23 vaccine is not recommended for American Indians, [place] Natives, or people younger than 65 years unless there are medical conditions that require PPSV23 vaccine. When indicated, people who have unknown immunization and have no record of immunization should receive PPSV23 vaccine. One-time revaccination 5 years after the first dose of PPSV23 is recommended for people aged 19 64 years who have chronic kidney failure, nephrotic syndrome, asplenia, or immunocompromised conditions. People who received 1 2 doses of PPSV23 before age 65 years should receive another dose of PPSV23 vaccine at age 65 years or later if at least 5 years have passed since the previous dose. Doses of PPSV23 are not needed for people immunized with PPSV23 at or after age 65 years.  Meningococcal vaccine. Adults with asplenia or persistent  complement component deficiencies should receive 2   doses of quadrivalent meningococcal conjugate (MenACWY-D) vaccine. The doses should be obtained at least 2 months apart. Microbiologists working with certain meningococcal bacteria, military recruits, people at risk during an outbreak, and people who travel to or live in countries with a high rate of meningitis should be immunized. A first-year college student up through age 21 years who is living in a residence hall should receive a dose if she did not receive a dose on or after her 16th birthday. Adults who have certain high-risk conditions should receive one or more doses of vaccine.  Hepatitis A vaccine. Adults who wish to be protected from this disease, have certain high-risk conditions, work with hepatitis A-infected animals, work in hepatitis A research labs, or travel to or work in countries with a high rate of hepatitis A should be immunized. Adults who were previously unvaccinated and who anticipate close contact with an international adoptee during the first 60 days after arrival in the [place] from a country with a high rate of hepatitis A should be immunized.  Hepatitis B vaccine. Adults who wish to be protected from this disease, have certain high-risk conditions, may be exposed to blood or other infectious body fluids, are household contacts or sex partners of hepatitis B positive people, are clients or workers in certain care facilities, or travel to or work in countries with a high rate of hepatitis B should be immunized.  Haemophilus influenzae type b (Hib) vaccine. A previously unvaccinated person with asplenia or sickle cell disease or having a scheduled splenectomy should receive 1 dose of Hib vaccine. Regardless of previous immunization, a recipient of a hematopoietic stem cell transplant should receive a 3-dose series 6 12 months after her successful transplant. Hib vaccine is not recommended for adults with HIV  infection. Preventive Services / Frequency Ages 19 to 39years  Blood pressure check.** / Every 1 to 2 years.  Lipid and cholesterol check.** / Every 5 years beginning at age 20.  Clinical breast exam.** / Every 3 years for women in their 20s and 30s.  BRCA-related cancer risk assessment.** / For women who have family members with a BRCA-related cancer (breast, ovarian, tubal, or peritoneal cancers).  Pap test.** / Every 2 years from ages 21 through 29. Every 3 years starting at age 30 through age 65 or 70 with a history of 3 consecutive normal Pap tests.  HPV screening.** / Every 3 years from ages 30 through ages 65 to 70 with a history of 3 consecutive normal Pap tests.  Hepatitis C blood test.** / For any individual with known risks for hepatitis C.  Skin self-exam. / Monthly.  Influenza vaccine. / Every year.  Tetanus, diphtheria, and acellular pertussis (Tdap, Td) vaccine.** / Consult your health care provider. Pregnant women should receive 1 dose of Tdap vaccine during each pregnancy. 1 dose of Td every 10 years.  Varicella vaccine.** / Consult your health care provider. Pregnant females who do not have evidence of immunity should receive the first dose after pregnancy.  HPV vaccine. / 3 doses over 6 months, if 26 and younger. The vaccine is not recommended for use in pregnant females. However, pregnancy testing is not needed before receiving a dose.  Measles, mumps, rubella (MMR) vaccine.** / You need at least 1 dose of MMR if you were born in 1957 or later. You may also need a 2nd dose. For females of childbearing age, rubella immunity should be determined. If there is no evidence of immunity, females who are not   pregnant should be vaccinated. If there is no evidence of immunity, females who are pregnant should delay immunization until after pregnancy.  Pneumococcal 13-valent conjugate (PCV13) vaccine.** / Consult your health care provider.  Pneumococcal polysaccharide (PPSV23)  vaccine.** / 1 to 2 doses if you smoke cigarettes or if you have certain conditions.  Meningococcal vaccine.** / 1 dose if you are age 88 to 41 years and a Market researcher living in a residence hall, or have one of several medical conditions, you need to get vaccinated against meningococcal disease. You may also need additional booster doses.  Hepatitis A vaccine.** / Consult your health care provider.  Hepatitis B vaccine.** / Consult your health care provider.  Haemophilus influenzae type b (Hib) vaccine.** / Consult your health care provider. Ages 45 to 64years  Blood pressure check.** / Every 1 to 2 years.  Lipid and cholesterol check.** / Every 5 years beginning at age 2 years.  Lung cancer screening. / Every year if you are aged 10 80 years and have a 30-pack-year history of smoking and currently smoke or have quit within the past 15 years. Yearly screening is stopped once you have quit smoking for at least 15 years or develop a health problem that would prevent you from having lung cancer treatment.  Clinical breast exam.** / Every year after age 28 years.  BRCA-related cancer risk assessment.** / For women who have family members with a BRCA-related cancer (breast, ovarian, tubal, or peritoneal cancers).  Mammogram.** / Every year beginning at age 63 years and continuing for as long as you are in good health. Consult with your health care provider.  Pap test.** / Every 3 years starting at age 71 years through age 39 or 90 years with a history of 3 consecutive normal Pap tests.  HPV screening.** / Every 3 years from ages 27 years through ages 32 to 72 years with a history of 3 consecutive normal Pap tests.  Fecal occult blood test (FOBT) of stool. / Every year beginning at age 40 years and continuing until age 29 years. You may not need to do this test if you get a colonoscopy every 10 years.  Flexible sigmoidoscopy or colonoscopy.** / Every 5 years for a flexible  sigmoidoscopy or every 10 years for a colonoscopy beginning at age 66 years and continuing until age 47 years.  Hepatitis C blood test.** / For all people born from [birth year] through 1963 and any individual with known risks for hepatitis C.  Skin self-exam. / Monthly.  Influenza vaccine. / Every year.  Tetanus, diphtheria, and acellular pertussis (Tdap/Td) vaccine.** / Consult your health care provider. Pregnant women should receive 1 dose of Tdap vaccine during each pregnancy. 1 dose of Td every 10 years.  Varicella vaccine.** / Consult your health care provider. Pregnant females who do not have evidence of immunity should receive the first dose after pregnancy.  Zoster vaccine.** / 1 dose for adults aged 39 years or older.  Measles, mumps, rubella (MMR) vaccine.** / You need at least 1 dose of MMR if you were born in 1955 or later. You may also need a 2nd dose. For females of childbearing age, rubella immunity should be determined. If there is no evidence of immunity, females who are not pregnant should be vaccinated. If there is no evidence of immunity, females who are pregnant should delay immunization until after pregnancy.  Pneumococcal 13-valent conjugate (PCV13) vaccine.** / Consult your health care provider.  Pneumococcal polysaccharide (PPSV23) vaccine.** / 1  to 2 doses if you smoke cigarettes or if you have certain conditions.  Meningococcal vaccine.** / Consult your health care provider.  Hepatitis A vaccine.** / Consult your health care provider.  Hepatitis B vaccine.** / Consult your health care provider.  Haemophilus influenzae type b (Hib) vaccine.** / Consult your health care provider. Ages 65 years and over  Blood pressure check.** / Every 1 to 2 years.  Lipid and cholesterol check.** / Every 5 years beginning at age 20 years.  Lung cancer screening. / Every year if you are aged 55 80 years and have a 30-pack-year history of smoking and currently smoke or have quit within the past 15 years.  Yearly screening is stopped once you have quit smoking for at least 15 years or develop a health problem that would prevent you from having lung cancer treatment.  Clinical breast exam.** / Every year after age 40 years.  BRCA-related cancer risk assessment.** / For women who have family members with a BRCA-related cancer (breast, ovarian, tubal, or peritoneal cancers).  Mammogram.** / Every year beginning at age 40 years and continuing for as long as you are in good health. Consult with your health care provider.  Pap test.** / Every 3 years starting at age 30 years through age 65 or 70 years with 3 consecutive normal Pap tests. Testing can be stopped between 65 and 70 years with 3 consecutive normal Pap tests and no abnormal Pap or HPV tests in the past 10 years.  HPV screening.** / Every 3 years from ages 30 years through ages 65 or 70 years with a history of 3 consecutive normal Pap tests. Testing can be stopped between 65 and 70 years with 3 consecutive normal Pap tests and no abnormal Pap or HPV tests in the past 10 years.  Fecal occult blood test (FOBT) of stool. / Every year beginning at age 50 years and continuing until age 75 years. You may not need to do this test if you get a colonoscopy every 10 years.  Flexible sigmoidoscopy or colonoscopy.** / Every 5 years for a flexible sigmoidoscopy or every 10 years for a colonoscopy beginning at age 50 years and continuing until age 75 years.  Hepatitis C blood test.** / For all people born from 1945 through 1965 and any individual with known risks for hepatitis C.  Osteoporosis screening.** / A one-time screening for women ages 65 years and over and women at risk for fractures or osteoporosis.  Skin self-exam. / Monthly.  Influenza vaccine. / Every year.  Tetanus, diphtheria, and acellular pertussis (Tdap/Td) vaccine.** / 1 dose of Td every 10 years.  Varicella vaccine.** / Consult your health care provider.  Zoster vaccine.** / 1  dose for adults aged 60 years or older.  Pneumococcal 13-valent conjugate (PCV13) vaccine.** / Consult your health care provider.  Pneumococcal polysaccharide (PPSV23) vaccine.** / 1 dose for all adults aged 65 years and older.  Meningococcal vaccine.** / Consult your health care provider.  Hepatitis A vaccine.** / Consult your health care provider.  Hepatitis B vaccine.** / Consult your health care provider.  Haemophilus influenzae type b (Hib) vaccine.** / Consult your health care provider. ** Family history and personal history of risk and conditions may change your health care provider's recommendations. Document Released: 02/14/2001 Document Revised: 10/09/2012 Document Reviewed: 05/16/2010 Exit[employer] Patient Information 2014 [employer].  

## 2013-02-12 NOTE — Addendum Note (Signed)
Addended by: Barbara CowerNOGUES, Jontay Maston L on: 02/12/2013 11:38 AM   Modules accepted: Orders

## 2013-02-12 NOTE — Progress Notes (Signed)
  Subjective:     Julie [name] is a 28 y.o. female G1P0101 with LMP 11/22/2012 and BMI 33 who is here for a comprehensive physical exam. The patient reports exposure to chlamydia. Patient reports her partner has a confirmed positive chlamydia result. She is using Nexplanon for contraception and has irregular bleeding.  History   Social History  . Marital Status: Single    Spouse Name: N/A    Number of Children: N/A  . Years of Education: N/A   Occupational History  . Not on file.   Social History Main Topics  . Smoking status: Never Smoker   . Smokeless tobacco: Not on file  . Alcohol Use: Yes  . Drug Use: No  . Sexual Activity: Yes    Partners: Male    Birth Control/ Protection: Implant   Other Topics Concern  . Not on file   Social History Narrative  . No narrative on file   Health Maintenance  Topic Date Due  . Tetanus/tdap  12/16/2006  . Influenza Vaccine  08/02/2012  . Pap Smear  03/08/2015   Past Medical History  Diagnosis Date  . Heart murmur   . Migraines   . Migraine    Past Surgical History  Procedure Laterality Date  . Wisdom tooth extraction     Family History  Problem Relation Age of Onset  . Diabetes Mother   . Hypertension Mother   . Dementia Maternal Grandmother   . Parkinson's disease Maternal Grandmother   . Hypertension Maternal Grandfather    History  Substance Use Topics  . Smoking status: Never Smoker   . Smokeless tobacco: Not on file  . Alcohol Use: Yes       Review of Systems A comprehensive review of systems was negative.   Objective:      GENERAL: Well-developed, well-nourished female in no acute distress.  HEENT: Normocephalic, atraumatic. Sclerae anicteric.  NECK: Supple. Normal thyroid.  LUNGS: Clear to auscultation bilaterally.  HEART: Regular rate and rhythm. BREASTS: Symmetric in size. No palpable masses or lymphadenopathy, skin changes, or nipple drainage. ABDOMEN: Soft, nontender, nondistended.  Obese PELVIC: Normal external female genitalia. Vagina is pink and rugated.  Normal discharge. Normal appearing cervix. Uterus is normal in size. No adnexal mass or tenderness. EXTREMITIES: No cyanosis, clubbing, or edema, 2+ distal pulses.    Assessment:    Healthy female exam.      Plan:    Pap smear with cultures collected Patient desires HIV, hepatitis and syphilis testing as well Rx Azithromycin called in Patient will be informed of any abnormal results.  Discussed weight loss with patient through exercise and proper diet. Patient admits to overindulging on food despite going to the gym everyday See After Visit Summary for Counseling Recommendations

## 2013-02-13 LAB — HEPATITIS B SURFACE ANTIGEN: HEP B S AG: NEGATIVE

## 2013-02-13 LAB — HIV ANTIBODY (ROUTINE TESTING W REFLEX): HIV: NONREACTIVE

## 2013-02-13 LAB — RPR

## 2013-02-13 LAB — HEPATITIS C ANTIBODY: HCV AB: NEGATIVE

## 2013-03-27 ENCOUNTER — Ambulatory Visit (INDEPENDENT_AMBULATORY_CARE_PROVIDER_SITE_OTHER): Payer: Medicaid Other | Admitting: Family Medicine

## 2013-03-27 ENCOUNTER — Encounter: Payer: Self-pay | Admitting: Family Medicine

## 2013-03-27 VITALS — BP 143/92 | HR 81 | Ht 67.0 in | Wt 223.0 lb

## 2013-03-27 DIAGNOSIS — R8290 Unspecified abnormal findings in urine: Secondary | ICD-10-CM

## 2013-03-27 DIAGNOSIS — N898 Other specified noninflammatory disorders of vagina: Secondary | ICD-10-CM

## 2013-03-27 DIAGNOSIS — R82998 Other abnormal findings in urine: Secondary | ICD-10-CM

## 2013-03-27 LAB — POCT URINALYSIS DIPSTICK
BILIRUBIN UA: NEGATIVE
GLUCOSE UA: NEGATIVE
Ketones, UA: NEGATIVE
LEUKOCYTES UA: NEGATIVE
NITRITE UA: NEGATIVE
Protein, UA: NEGATIVE
Spec Grav, UA: 1.02
UROBILINOGEN UA: NEGATIVE
pH, UA: 6

## 2013-03-27 NOTE — Progress Notes (Signed)
    Subjective:    Patient ID: Julie [name] is a 28 y.o. female presenting with Urinary Tract Infection and Vaginal Discharge  on 03/27/2013  HPI: Having thick vaginal discharge.  Noted to have yeast on pap--not treated for that.  Had exposure to chlamydia, but negative test.  Took azithromycin.  No real itching. Some cramping but no pain.  No fever or chills.   Review of Systems  Constitutional: Negative for fever and chills.  Respiratory: Negative for shortness of breath.   Cardiovascular: Negative for chest pain.  Gastrointestinal: Positive for nausea and abdominal pain. Negative for vomiting.  Genitourinary: Negative for dysuria.  Skin: Negative for rash.  Neurological: Positive for headaches.      Objective:    BP 143/92  Pulse 81  Ht 5\' 7"  (1.702 m)  Wt 223 lb (101.152 kg)  BMI 34.92 kg/m2 Physical Exam  Constitutional: She is oriented to person, place, and time. She appears well-developed and well-nourished. No distress.  HENT:  Head: Normocephalic and atraumatic.  Eyes: No scleral icterus.  Neck: Neck supple.  Cardiovascular: Normal rate.   Pulmonary/Chest: Effort normal.  Abdominal: Soft.  Neurological: She is alert and oriented to person, place, and time.  Skin: Skin is warm and dry.  Psychiatric: She has a normal mood and affect.        Assessment & Plan:   Cloudy urine - Plan: POCT Urinalysis Dipstick, Urine culture, CANCELED: Urinalysis, dipstick only, CANCELED: Urinalysis, dipstick only  Vaginal discharge - Plan: Urine culture, Wet prep, genital, GC/chlamydia probe amp, genital Treat based on results.  Some hematuria Will check culture.  Return if symptoms worsen or fail to improve.

## 2013-03-27 NOTE — Patient Instructions (Signed)
Vaginitis Vaginitis is an inflammation of the vagina. It is most often caused by a change in the normal balance of the bacteria and yeast that live in the vagina. This change in balance causes an overgrowth of certain bacteria or yeast, which causes the inflammation. There are different types of vaginitis, but the most common types are:  Bacterial vaginosis.  Yeast infection (candidiasis).  Trichomoniasis vaginitis. This is a sexually transmitted infection (STI).  Viral vaginitis.  Atropic vaginitis.  Allergic vaginitis. CAUSES  The cause depends on the type of vaginitis. Vaginitis can be caused by:  Bacteria (bacterial vaginosis).  Yeast (yeast infection).  A parasite (trichomoniasis vaginitis)  A virus (viral vaginitis).  Low hormone levels (atrophic vaginitis). Low hormone levels can occur during pregnancy, breastfeeding, or after menopause.  Irritants, such as bubble baths, scented tampons, and feminine sprays (allergic vaginitis). Other factors can change the normal balance of the yeast and bacteria that live in the vagina. These include:  Antibiotic medicines.  Poor hygiene.  Diaphragms, vaginal sponges, spermicides, birth control pills, and intrauterine devices (IUD).  Sexual intercourse.  Infection.  Uncontrolled diabetes.  A weakened immune system. SYMPTOMS  Symptoms can vary depending on the cause of the vaginitis. Common symptoms include:  Abnormal vaginal discharge.  The discharge is white, gray, or yellow with bacterial vaginosis.  The discharge is thick, white, and cheesy with a yeast infection.  The discharge is frothy and yellow or greenish with trichomoniasis.  A bad vaginal odor.  The odor is fishy with bacterial vaginosis.  Vaginal itching, pain, or swelling.  Painful intercourse.  Pain or burning when urinating. Sometimes, there are no symptoms. TREATMENT  Treatment will vary depending on the type of infection.   Bacterial  vaginosis and trichomoniasis are often treated with antibiotic creams or pills.  Yeast infections are often treated with antifungal medicines, such as vaginal creams or suppositories.  Viral vaginitis has no cure, but symptoms can be treated with medicines that relieve discomfort. Your sexual partner should be treated as well.  Atrophic vaginitis may be treated with an estrogen cream, pill, suppository, or vaginal ring. If vaginal dryness occurs, lubricants and moisturizing creams may help. You may be told to avoid scented soaps, sprays, or douches.  Allergic vaginitis treatment involves quitting the use of the product that is causing the problem. Vaginal creams can be used to treat the symptoms. HOME CARE INSTRUCTIONS   Take all medicines as directed by your caregiver.  Keep your genital area clean and dry. Avoid soap and only rinse the area with water.  Avoid douching. It can remove the healthy bacteria in the vagina.  Do not use tampons or have sexual intercourse until your vaginitis has been treated. Use sanitary pads while you have vaginitis.  Wipe from front to back. This avoids the spread of bacteria from the rectum to the vagina.  Let air reach your genital area.  Wear cotton underwear to decrease moisture buildup.  Avoid wearing underwear while you sleep until your vaginitis is gone.  Avoid tight pants and underwear or nylons without a cotton panel.  Take off wet clothing (especially bathing suits) as soon as possible.  Use mild, non-scented products. Avoid using irritants, such as:  Scented feminine sprays.  Fabric softeners.  Scented detergents.  Scented tampons.  Scented soaps or bubble baths.  Practice safe sex and use condoms. Condoms may prevent the spread of trichomoniasis and viral vaginitis. SEEK MEDICAL CARE IF:   You have abdominal pain.  You   have a fever or persistent symptoms for more than 2 3 days.  You have a fever and your symptoms suddenly  get worse. Document Released: 10/16/2006 Document Revised: 09/13/2011 Document Reviewed: 06/01/2011 [employer] Patient Information 2014 [employer].  

## 2013-03-28 ENCOUNTER — Telehealth: Payer: Self-pay | Admitting: *Deleted

## 2013-03-28 DIAGNOSIS — N760 Acute vaginitis: Principal | ICD-10-CM

## 2013-03-28 DIAGNOSIS — B9689 Other specified bacterial agents as the cause of diseases classified elsewhere: Secondary | ICD-10-CM

## 2013-03-28 LAB — URINE CULTURE
Colony Count: NO GROWTH
ORGANISM ID, BACTERIA: NO GROWTH

## 2013-03-28 LAB — WET PREP, GENITAL
Trich, Wet Prep: NONE SEEN
YEAST WET PREP: NONE SEEN

## 2013-03-28 LAB — GC/CHLAMYDIA PROBE AMP
CT PROBE, AMP APTIMA: NEGATIVE
GC Probe RNA: NEGATIVE

## 2013-03-28 MED ORDER — METRONIDAZOLE 500 MG PO TABS: 500.0000 mg | ORAL_TABLET | Freq: Two times a day (BID) | ORAL | Status: DC

## 2013-03-28 NOTE — Telephone Encounter (Signed)
Patient wishes medication to be called to different pharmacy.

## 2013-03-28 NOTE — Telephone Encounter (Signed)
Message copied by Barbara CowerNOGUES, Geeta Dworkin L on Fri Mar 28, 2013 11:24 AM ------      Message from: Reva BoresPRATT, TANYA S      Created: Fri Mar 28, 2013  8:28 AM       Has BV--call in flagyl 500 mg bid x 7 d ------

## 2013-05-20 ENCOUNTER — Encounter (HOSPITAL_COMMUNITY): Payer: Self-pay | Admitting: Emergency Medicine

## 2013-05-20 ENCOUNTER — Emergency Department (HOSPITAL_COMMUNITY)
Admission: EM | Admit: 2013-05-20 | Discharge: 2013-05-20 | Disposition: A | Discharge status: Home / Self Care | Payer: Medicaid Other | Attending: Emergency Medicine | Admitting: Emergency Medicine

## 2013-05-20 DIAGNOSIS — Z8669 Personal history of other diseases of the nervous system and sense organs: Secondary | ICD-10-CM | POA: Insufficient documentation

## 2013-05-20 DIAGNOSIS — B354 Tinea corporis: Secondary | ICD-10-CM | POA: Insufficient documentation

## 2013-05-20 DIAGNOSIS — R011 Cardiac murmur, unspecified: Secondary | ICD-10-CM | POA: Insufficient documentation

## 2013-05-20 MED ORDER — CLOTRIMAZOLE-BETAMETHASONE 1-0.05 % EX CREA: TOPICAL_CREAM | CUTANEOUS | Status: DC

## 2013-05-20 NOTE — Discharge Instructions (Signed)
Cutaneous Candidiasis Cutaneous candidiasis is a condition in which there is an overgrowth of yeast (candida) on the skin. Yeast normally live on the skin, but in small enough numbers not to cause any symptoms. In certain cases, increased growth of the yeast may cause an actual yeast infection. This kind of infection usually occurs in areas of the skin that are constantly warm and moist, such as the armpits or the groin. Yeast is the most common cause of diaper rash in babies and in people who cannot control their bowel movements (incontinence). CAUSES  The fungus that most often causes cutaneous candidiasis is Candida albicans. Conditions that can increase the risk of getting a yeast infection of the skin include:  Obesity.  Pregnancy.  Diabetes.  Taking antibiotic medicine.  Taking birth control pills.  Taking steroid medicines.  Thyroid disease.  An iron or zinc deficiency.  Problems with the immune system. SYMPTOMS   Red, swollen area of the skin.  Bumps on the skin.  Itchiness. DIAGNOSIS  The diagnosis of cutaneous candidiasis is usually based on its appearance. Light scrapings of the skin may also be taken and viewed under a microscope to identify the presence of yeast. TREATMENT  Antifungal creams may be applied to the infected skin. In severe cases, oral medicines may be needed.  HOME CARE INSTRUCTIONS   Keep your skin clean and dry.  Maintain a healthy weight.  If you have diabetes, keep your blood sugar under control. SEEK IMMEDIATE MEDICAL CARE IF:  Your rash continues to spread despite treatment.  You have a fever, chills, or abdominal pain. Document Released: 09/06/2010 Document Revised: 03/13/2011 Document Reviewed: 09/06/2010 [employer] Patient Information 2014 [employer].  

## 2013-05-20 NOTE — ED Provider Notes (Signed)
CSN: [id number]     Arrival date & time 05/20/13  1744 History  This chart was scribed for non-physician practitioner Elpidio AnisShari Rigby Leonhardt PA-C working with Junius ArgyleForrest S Harrison, MD by Danella Maiersaroline Early, ED Scribe. This patient was seen in room TR04C/TR04C and the patient's care was started at 6:13 PM.    Chief Complaint  Patient presents with  . Rash   The history is provided by the patient. No language interpreter was used.   HPI Comments: Julie [name] is a 28 y.o. female who presents to the Emergency Department complaining of a gradually-worsening painful, itchy rash to the underside of both breasts extending into the midline chest onset 3 weeks ago. She denies any rash in the groin area. She has tried aloe vera, vinegar, and itchy cream with no relief.     Past Medical History  Diagnosis Date  . Heart murmur   . Migraines   . Migraine    Past Surgical History  Procedure Laterality Date  . Wisdom tooth extraction     Family History  Problem Relation Age of Onset  . Diabetes Mother   . Hypertension Mother   . Dementia Maternal Grandmother   . Parkinson's disease Maternal Grandmother   . Hypertension Maternal Grandfather    History  Substance Use Topics  . Smoking status: Never Smoker   . Smokeless tobacco: Not on file  . Alcohol Use: Yes   OB History   Grav Para Term Preterm Abortions TAB SAB Ect Mult Living   1 1  1      1      Review of Systems  Constitutional: Negative for fever.  Skin: Positive for rash.  All other systems reviewed and are negative.     Allergies  Acetaminophen and Codeine  Home Medications   Prior to Admission medications   Medication Sig Start Date End Date Taking? Authorizing Provider  diphenhydrAMINE (SOMINEX) 25 MG tablet Take 25 mg by mouth at bedtime as needed for sleep.   Yes Historical Provider, MD  metroNIDAZOLE (FLAGYL) 500 MG tablet Take 1 tablet (500 mg total) by mouth 2 (two) times daily. 03/28/13   Reva Boresanya S Pratt, MD   BP 121/67   Pulse 83  Temp(Src) 98 F (36.7 C) (Oral)  Resp 16  Ht 5\' 7"  (1.702 m)  Wt 223 lb (101.152 kg)  BMI 34.92 kg/m2  SpO2 100%  LMP 05/15/2013 Physical Exam  Nursing note and vitals reviewed. Constitutional: She is oriented to person, place, and time. She appears well-developed and well-nourished. No distress.  HENT:  Head: Normocephalic and atraumatic.  Eyes: EOM are normal.  Neck: Neck supple. No tracheal deviation present.  Cardiovascular: Normal rate.   Pulmonary/Chest: Effort normal. No respiratory distress.  Musculoskeletal: Normal range of motion.  Neurological: She is alert and oriented to person, place, and time.  Skin: Skin is warm and dry.  Maculopapular confluent wet rash. Most prominent under the right breast extending to midline chest. There is no purulent drainage or other evidence of infection.   Psychiatric: She has a normal mood and affect. Her behavior is normal.    ED Course  Procedures (including critical care time) Medications - No data to display  DIAGNOSTIC STUDIES: Oxygen Saturation is 100% on RA, normal by my interpretation.    COORDINATION OF CARE: 6:52 PM- Discussed treatment plan with pt which includes itching cream and referral to dermatology. Pt agrees to plan.    Labs Review Labs Reviewed - No data to display  Imaging Review No results found.   EKG Interpretation None      MDM   Final diagnoses:  None    1. Tinea corporis  Uncomplicated tinea rash. Rx - lotrisone. Dermatology referral prn  I personally performed the services described in this documentation, which was scribed in my presence. The recorded information has been reviewed and is accurate.     Arnoldo HookerShari A Ra Pfiester, PA-C 05/20/13 1909

## 2013-05-20 NOTE — ED Notes (Signed)
She has a painful itchy red rash under bilateral breasts for days. Shes tried several otc meds with no relief.

## 2013-05-21 NOTE — ED Provider Notes (Signed)
Medical screening examination/treatment/procedure(s) were performed by non-physician practitioner and as supervising physician I was immediately available for consultation/collaboration.   EKG Interpretation None        Junius ArgyleForrest S Elira Colasanti, MD 05/21/13 2031

## 2013-10-18 ENCOUNTER — Encounter (HOSPITAL_COMMUNITY): Payer: Self-pay | Admitting: Emergency Medicine

## 2013-10-18 ENCOUNTER — Emergency Department (HOSPITAL_COMMUNITY)
Admission: EM | Admit: 2013-10-18 | Discharge: 2013-10-18 | Disposition: A | Discharge status: Home / Self Care | Payer: Medicaid Other | Attending: Emergency Medicine | Admitting: Emergency Medicine

## 2013-10-18 DIAGNOSIS — G43909 Migraine, unspecified, not intractable, without status migrainosus: Secondary | ICD-10-CM | POA: Diagnosis present

## 2013-10-18 DIAGNOSIS — G43809 Other migraine, not intractable, without status migrainosus: Secondary | ICD-10-CM

## 2013-10-18 DIAGNOSIS — R011 Cardiac murmur, unspecified: Secondary | ICD-10-CM | POA: Diagnosis not present

## 2013-10-18 DIAGNOSIS — Z79899 Other long term (current) drug therapy: Secondary | ICD-10-CM | POA: Insufficient documentation

## 2013-10-18 MED ORDER — SODIUM CHLORIDE 0.9 % IV BOLUS (SEPSIS)
1000.0000 mL | Freq: Once | INTRAVENOUS | Status: AC
  Administered 2013-10-18: 1000 mL via INTRAVENOUS

## 2013-10-18 MED ORDER — DIPHENHYDRAMINE HCL 50 MG/ML IJ SOLN
25.0000 mg | Freq: Once | INTRAMUSCULAR | Status: AC
  Administered 2013-10-18: 25 mg via INTRAVENOUS
  Filled 2013-10-18: qty 1

## 2013-10-18 MED ORDER — DEXAMETHASONE SODIUM PHOSPHATE 10 MG/ML IJ SOLN
10.0000 mg | Freq: Once | INTRAMUSCULAR | Status: AC
  Administered 2013-10-18: 10 mg via INTRAVENOUS
  Filled 2013-10-18: qty 1

## 2013-10-18 MED ORDER — KETOROLAC TROMETHAMINE 30 MG/ML IJ SOLN
30.0000 mg | Freq: Once | INTRAMUSCULAR | Status: AC
  Administered 2013-10-18: 30 mg via INTRAVENOUS
  Filled 2013-10-18: qty 1

## 2013-10-18 MED ORDER — METOCLOPRAMIDE HCL 5 MG/ML IJ SOLN
10.0000 mg | Freq: Once | INTRAMUSCULAR | Status: AC
  Administered 2013-10-18: 10 mg via INTRAVENOUS
  Filled 2013-10-18: qty 2

## 2013-10-18 MED ORDER — SUMATRIPTAN SUCCINATE 100 MG PO TABS: 100.0000 mg | ORAL_TABLET | ORAL | Status: DC | PRN

## 2013-10-18 NOTE — ED Notes (Signed)
Pt reports migraine x 2 days that "is throbbing to my whole face." Pt reports taking Advil and BC powder with no relief. Pt reports N/V due to pain. Pt photophobia, but denies blurred vision/double vision. Neuro intact.

## 2013-10-18 NOTE — Discharge Instructions (Signed)

## 2013-10-18 NOTE — ED Provider Notes (Addendum)
TIME SEEN: 3:05 PM  CHIEF COMPLAINT: Migraine  HPI: Patient is a 28 year old female with history of migraine headaches who presents to the emergency department with 2 days of a frontal throbbing headache that radiates into her face. This was similar to her prior migraines. She has had photophobia, nausea and vomiting. No fevers or chills, neck pain or neck stiffness, recent head injury, numbness, tingling or focal weakness. No thunderclap or sudden onset headache. No worst headache of her life. She's tried ibuprofen and BC powder at home without relief.  ROS: See HPI Constitutional: no fever  Eyes: no drainage  ENT: no runny nose   Cardiovascular:  no chest pain  Resp: no SOB  GI: no vomiting GU: no dysuria Integumentary: no rash  Allergy: no hives  Musculoskeletal: no leg swelling  Neurological: no slurred speech ROS otherwise negative  PAST MEDICAL HISTORY/PAST SURGICAL HISTORY:  Past Medical History  Diagnosis Date  . Heart murmur   . Migraines   . Migraine     MEDICATIONS:  Prior to Admission medications   Medication Sig Start Date End Date Taking? Authorizing Provider  Aspirin-Salicylamide-Caffeine (BC HEADACHE POWDER PO) Take 1 Package by mouth daily as needed (for migraine or headache).   Yes Historical Provider, MD  ibuprofen (ADVIL,MOTRIN) 200 MG tablet Take 1,000 mg by mouth every 6 (six) hours as needed for headache or moderate pain.   Yes Historical Provider, MD    ALLERGIES:  Allergies  Allergen Reactions  . Acetaminophen Shortness Of Breath, Swelling and Rash  . Codeine Shortness Of Breath, Swelling, Rash and Other (See Comments)    Hands swell    SOCIAL HISTORY:  History  Substance Use Topics  . Smoking status: Never Smoker   . Smokeless tobacco: Not on file  . Alcohol Use: Yes    FAMILY HISTORY: Family History  Problem Relation Age of Onset  . Diabetes Mother   . Hypertension Mother   . Dementia Maternal Grandmother   . Parkinson's disease  Maternal Grandmother   . Hypertension Maternal Grandfather     EXAM: BP 151/79  Pulse 92  Temp(Src) 97.7 F (36.5 C) (Oral)  Resp 18  Ht 5\' 7"  (1.702 m)  Wt 223 lb (101.152 kg)  BMI 34.92 kg/m2  SpO2 98%  LMP 10/11/2013 CONSTITUTIONAL: Alert and oriented and responds appropriately to questions. Well-appearing; well-nourished HEAD: Normocephalic EYES: Conjunctivae clear, PERRL, patient has photophobia ENT: normal nose; no rhinorrhea; moist mucous membranes; pharynx without lesions noted NECK: Supple, no meningismus, no LAD  CARD: RRR; S1 and S2 appreciated; no murmurs, no clicks, no rubs, no gallops RESP: Normal chest excursion without splinting or tachypnea; breath sounds clear and equal bilaterally; no wheezes, no rhonchi, no rales,  ABD/GI: Normal bowel sounds; non-distended; soft, non-tender, no rebound, no guarding BACK:  The back appears normal and is non-tender to palpation, there is no CVA tenderness EXT: Normal ROM in all joints; non-tender to palpation; no edema; normal capillary refill; no cyanosis    SKIN: Normal color for age and race; warm NEURO: Moves all extremities equally; sensation to light touch intact diffusely, cranial nerves II through XII intact, normal gait PSYCH: The patient's mood and manner are appropriate. Grooming and personal hygiene are appropriate.  MEDICAL DECISION MAKING: Patient here for migraine headache this is similar to her prior migraines. No infectious symptoms. No head injury. She is not on anticoagulation. She is neurologically intact. We'll treat with migraine cocktail including Toradol, Reglan, Benadryl, Decadron and IV fluids. I do  not feel there is any intracranial hemorrhage, infarct, cavernous sinus thrombosis. I do not feel she needs head imaging at this time.  ED PROGRESS: 4:26 PM  Pt reports her migraine headache is almost completely gone. She feels that she is ready for discharge home. We'll discharge with prescription for Imitrex.  Discussed return precautions and supportive care instructions. She verbalizes understanding is comfortable with plan.     Layla MawKristen N Ward, DO 10/18/13 1627  Layla MawKristen N Ward, DO 10/18/13 [id number]

## 2013-11-03 ENCOUNTER — Encounter (HOSPITAL_COMMUNITY): Payer: Self-pay | Admitting: Emergency Medicine

## 2013-11-05 ENCOUNTER — Ambulatory Visit: Payer: Medicaid Other | Admitting: Internal Medicine

## 2013-11-06 ENCOUNTER — Ambulatory Visit (HOSPITAL_BASED_OUTPATIENT_CLINIC_OR_DEPARTMENT_OTHER): Payer: Self-pay

## 2013-11-06 ENCOUNTER — Ambulatory Visit: Payer: Medicaid Other | Attending: Internal Medicine | Admitting: Internal Medicine

## 2013-11-06 ENCOUNTER — Encounter: Payer: Self-pay | Admitting: Internal Medicine

## 2013-11-06 VITALS — BP 144/87 | HR 71 | Temp 99.1°F | Resp 16 | Wt 228.8 lb

## 2013-11-06 DIAGNOSIS — R030 Elevated blood-pressure reading, without diagnosis of hypertension: Secondary | ICD-10-CM

## 2013-11-06 DIAGNOSIS — G43909 Migraine, unspecified, not intractable, without status migrainosus: Secondary | ICD-10-CM | POA: Insufficient documentation

## 2013-11-06 DIAGNOSIS — Z8249 Family history of ischemic heart disease and other diseases of the circulatory system: Secondary | ICD-10-CM | POA: Insufficient documentation

## 2013-11-06 DIAGNOSIS — I10 Essential (primary) hypertension: Secondary | ICD-10-CM | POA: Diagnosis not present

## 2013-11-06 DIAGNOSIS — Z23 Encounter for immunization: Secondary | ICD-10-CM | POA: Insufficient documentation

## 2013-11-06 DIAGNOSIS — F17200 Nicotine dependence, unspecified, uncomplicated: Secondary | ICD-10-CM | POA: Diagnosis not present

## 2013-11-06 DIAGNOSIS — Z720 Tobacco use: Secondary | ICD-10-CM

## 2013-11-06 DIAGNOSIS — IMO0001 Reserved for inherently not codable concepts without codable children: Secondary | ICD-10-CM | POA: Insufficient documentation

## 2013-11-06 DIAGNOSIS — G43009 Migraine without aura, not intractable, without status migrainosus: Secondary | ICD-10-CM

## 2013-11-06 DIAGNOSIS — Z139 Encounter for screening, unspecified: Secondary | ICD-10-CM

## 2013-11-06 MED ORDER — IBUPROFEN 800 MG PO TABS: 800.0000 mg | ORAL_TABLET | Freq: Three times a day (TID) | ORAL | Status: DC | PRN

## 2013-11-06 NOTE — Progress Notes (Signed)
Patient Demographics  Julie [name], is a 28 y.o. female  ZOX:[id number]  [id number]  DOB - 06-25-1985  CC:  Chief Complaint  Patient presents with  . Establish Care    migraines       HPI: Julie [name] is a 28 y.o. female here today to establish medical care.she is history of migraine headaches for 16 years, last month she went to the emergency room with symptoms of migraine headaches,patient currently takes ibuprofen when necessary, EMR reviewed patient was also prescribed Imitrex as per patient she has not filled the prescription yet her, as per patient she used to see a neurologist in the past and was on 3 different medications including prophylaxis medication. As per patient she took ibuprofen today, denies any numbness weakness, today her blood pressure is elevated, she does report family history of hypertension. Patient has No headache, No chest pain, No abdominal pain - No Nausea, No new weakness tingling or numbness, No Cough - SOB.  Allergies  Allergen Reactions  . Acetaminophen Shortness Of Breath, Swelling and Rash  . Codeine Shortness Of Breath, Swelling, Rash and Other (See Comments)    Hands swell   Past Medical History  Diagnosis Date  . Heart murmur   . Migraines   . Migraine    Current Outpatient Prescriptions on File Prior to Visit  Medication Sig Dispense Refill  . Aspirin-Salicylamide-Caffeine (BC HEADACHE POWDER PO) Take 1 Package by mouth daily as needed (for migraine or headache).    . SUMAtriptan (IMITREX) 100 MG tablet Take 1 tablet (100 mg total) by mouth every 2 (two) hours as needed for migraine or headache. May repeat in 2 hours if headache persists or recurs. 10 tablet 0   No current facility-administered medications on file prior to visit.   Family History  Problem Relation Age of Onset  . Diabetes Mother   . Hypertension Mother   . Dementia Maternal Grandmother   . Parkinson's disease Maternal Grandmother   . Hypertension  Maternal Grandfather   . Hypertension Paternal Aunt   . Cancer Paternal Grandfather    History   Social History  . Marital Status: Single    Spouse Name: N/A    Number of Children: N/A  . Years of Education: N/A   Occupational History  . Not on file.   Social History Main Topics  . Smoking status: Current Every Day Smoker  . Smokeless tobacco: Not on file  . Alcohol Use: Yes     Comment: socially   . Drug Use: No  . Sexual Activity:    Partners: Male    Birth Control/ Protection: Implant   Other Topics Concern  . Not on file   Social History Narrative    Review of Systems: Constitutional: Negative for fever, chills, diaphoresis, activity change, appetite change and fatigue. HENT: Negative for ear pain, nosebleeds, congestion, facial swelling, rhinorrhea, neck pain, neck stiffness and ear discharge.  Eyes: Negative for pain, discharge, redness, itching and visual disturbance. Respiratory: Negative for cough, choking, chest tightness, shortness of breath, wheezing and stridor.  Cardiovascular: Negative for chest pain, palpitations and leg swelling. Gastrointestinal: Negative for abdominal distention. Genitourinary: Negative for dysuria, urgency, frequency, hematuria, flank pain, decreased urine volume, difficulty urinating and dyspareunia.  Musculoskeletal: Negative for back pain, joint swelling, arthralgia and gait problem. Neurological: Negative for dizziness, tremors, seizures, syncope, facial asymmetry, speech difficulty, weakness, light-headedness, numbness and headaches.  Hematological: Negative for adenopathy. Does not bruise/bleed easily. Psychiatric/Behavioral: Negative for hallucinations, behavioral  problems, confusion, dysphoric mood, decreased concentration and agitation.    Objective:   Filed Vitals:   11/06/13 1209  BP: 144/87  Pulse: 71  Temp: 99.1 F (37.3 C)  Resp: 16    Physical Exam: Constitutional: obese female sitting comfortably not in  acute distress HENT: Normocephalic, atraumatic, External right and left ear normal. Oropharynx is clear and moist.  Eyes: Conjunctivae and EOM are normal. PERRLA, no scleral icterus. Neck: Normal ROM. Neck supple. No JVD. No tracheal deviation. No thyromegaly. CVS: RRR, S1/S2 +, no murmurs, no gallops, no carotid bruit.  Pulmonary: Effort and breath sounds normal, no stridor, rhonchi, wheezes, rales.  Abdominal: Soft. BS +, no distension, tenderness, rebound or guarding.  Musculoskeletal: Normal range of motion. No edema and no tenderness.  Neuro: Alert. Normal reflexes, muscle tone coordination. No cranial nerve deficit. Skin: Skin is warm and dry. No rash noted. Not diaphoretic. No erythema. No pallor. Psychiatric: Normal mood and affect. Behavior, judgment, thought content normal.  Lab Results  Component Value Date   WBC 8.8 09/13/2012   HGB 12.1 09/13/2012   HCT 35.2* 09/13/2012   MCV 77.9* 09/13/2012   PLT 211 09/13/2012   Lab Results  Component Value Date   CREATININE 0.86 09/13/2012   BUN 17 09/13/2012   NA 137 09/13/2012   K 3.8 09/13/2012   CL 103 09/13/2012   CO2 22 09/13/2012    Lab Results  Component Value Date   HGBA1C 5.9* 03/07/2012   Lipid Panel  No results found for: CHOL, TRIG, HDL, CHOLHDL, VLDL, LDLCALC     Assessment and plan:   1. Migraine without aura and without status migrainosus, not intractable  - ibuprofen (ADVIL,MOTRIN) 800 MG tablet; Take 1 tablet (800 mg total) by mouth every 8 (eight) hours as needed.  Dispense: 30 tablet; Refill: 1, patient will filling the prescription for Imitrex. - Ambulatory referral to Neurology  2. Needs flu shot Flu shot given today  3. Screening Ordered baseline blood work  - CBC with Differential - COMPLETE METABOLIC PANEL WITH GFR - TSH - Vit D  25 hydroxy (rtn osteoporosis monitoring) - Hemoglobin A1c  4. Smoking/elevated BP Advised patient to quit smoking, advise for DASH diet.      Health  Maintenance  -Vaccinations:  Flu shot today   Return in about 3 months (around 02/06/2014) for migraine.    Doris Cheadle, MD

## 2013-11-06 NOTE — Progress Notes (Signed)
Patient here to establish care Complains of having migraines on and off since oct 17th Patient states she was diagnosed with migraines at the age of 3nine

## 2013-11-06 NOTE — Patient Instructions (Addendum)
Smoking Cessation Quitting smoking is important to your health and has many advantages. However, it is not always easy to quit since nicotine is a very addictive drug. Oftentimes, people try 3 times or more before being able to quit. This document explains the best ways for you to prepare to quit smoking. Quitting takes hard work and a lot of effort, but you can do it. ADVANTAGES OF QUITTING SMOKING  You will live longer, feel better, and live better.  Your body will feel the impact of quitting smoking almost immediately.  Within 20 minutes, blood pressure decreases. Your pulse returns to its normal level.  After 8 hours, carbon monoxide levels in the blood return to normal. Your oxygen level increases.  After 24 hours, the chance of having a heart attack starts to decrease. Your breath, hair, and body stop smelling like smoke.  After 48 hours, damaged nerve endings begin to recover. Your sense of taste and smell improve.  After 72 hours, the body is virtually free of nicotine. Your bronchial tubes relax and breathing becomes easier.  After 2 to 12 weeks, lungs can hold more air. Exercise becomes easier and circulation improves.  The risk of having a heart attack, stroke, cancer, or lung disease is greatly reduced.  After 1 year, the risk of coronary heart disease is cut in half.  After 5 years, the risk of stroke falls to the same as a nonsmoker.  After 10 years, the risk of lung cancer is cut in half and the risk of other cancers decreases significantly.  After 15 years, the risk of coronary heart disease drops, usually to the level of a nonsmoker.  If you are pregnant, quitting smoking will improve your chances of having a healthy baby.  The people you live with, especially any children, will be healthier.  You will have extra money to spend on things other than cigarettes. QUESTIONS TO THINK ABOUT BEFORE ATTEMPTING TO QUIT You may want to talk about your answers with your  health care provider.  Why do you want to quit?  If you tried to quit in the past, what helped and what did not?  What will be the most difficult situations for you after you quit? How will you plan to handle them?  Who can help you through the tough times? Your family? Friends? A health care provider?  What pleasures do you get from smoking? What ways can you still get pleasure if you quit? Here are some questions to ask your health care provider:  How can you help me to be successful at quitting?  What medicine do you think would be best for me and how should I take it?  What should I do if I need more help?  What is smoking withdrawal like? How can I get information on withdrawal? GET READY  Set a quit date.  Change your environment by getting rid of all cigarettes, ashtrays, matches, and lighters in your home, car, or work. Do not let people smoke in your home.  Review your past attempts to quit. Think about what worked and what did not. GET SUPPORT AND ENCOURAGEMENT You have a better chance of being successful if you have help. You can get support in many ways.  Tell your family, friends, and coworkers that you are going to quit and need their support. Ask them not to smoke around you.  Get individual, group, or telephone counseling and support. Programs are available at local hospitals and health centers. Call   your local health department for information about programs in your area.  Spiritual beliefs and practices may help some smokers quit.  Download a "quit meter" on your computer to keep track of quit statistics, such as how long you have gone without smoking, cigarettes not smoked, and money saved.  Get a self-help book about quitting smoking and staying off tobacco. LEARN NEW SKILLS AND BEHAVIORS  Distract yourself from urges to smoke. Talk to someone, go for a walk, or occupy your time with a task.  Change your normal routine. Take a different route to work.  Drink tea instead of coffee. Eat breakfast in a different place.  Reduce your stress. Take a hot bath, exercise, or read a book.  Plan something enjoyable to do every day. Reward yourself for not smoking.  Explore interactive web-based programs that specialize in helping you quit. GET MEDICINE AND USE IT CORRECTLY Medicines can help you stop smoking and decrease the urge to smoke. Combining medicine with the above behavioral methods and support can greatly increase your chances of successfully quitting smoking.  Nicotine replacement therapy helps deliver nicotine to your body without the negative effects and risks of smoking. Nicotine replacement therapy includes nicotine gum, lozenges, inhalers, nasal sprays, and skin patches. Some may be available over-the-counter and others require a prescription.  Antidepressant medicine helps people abstain from smoking, but how this works is unknown. This medicine is available by prescription.  Nicotinic receptor partial agonist medicine simulates the effect of nicotine in your brain. This medicine is available by prescription. Ask your health care provider for advice about which medicines to use and how to use them based on your health history. Your health care provider will tell you what side effects to look out for if you choose to be on a medicine or therapy. Carefully read the information on the package. Do not use any other product containing nicotine while using a nicotine replacement product.  RELAPSE OR DIFFICULT SITUATIONS Most relapses occur within the first 3 months after quitting. Do not be discouraged if you start smoking again. Remember, most people try several times before finally quitting. You may have symptoms of withdrawal because your body is used to nicotine. You may crave cigarettes, be irritable, feel very hungry, cough often, get headaches, or have difficulty concentrating. The withdrawal symptoms are only temporary. They are strongest  when you first quit, but they will go away within 10-14 days. To reduce the chances of relapse, try to:  Avoid drinking alcohol. Drinking lowers your chances of successfully quitting.  Reduce the amount of caffeine you consume. Once you quit smoking, the amount of caffeine in your body increases and can give you symptoms, such as a rapid heartbeat, sweating, and anxiety.  Avoid smokers because they can make you want to smoke.  Do not let weight gain distract you. Many smokers will gain weight when they quit, usually less than 10 pounds. Eat a healthy diet and stay active. You can always lose the weight gained after you quit.  Find ways to improve your mood other than smoking. FOR MORE INFORMATION  www.smokefree.gov  Document Released: 12/13/2000 Document Revised: 05/05/2013 Document Reviewed: 03/30/2011 Exit[employer] Patient Information 2015 [employer]. This information is not intended to replace advice given to you by your health care provider. Make sure you discuss any questions you have with your health care provider. DASH Eating Plan DASH stands for "Dietary Approaches to Stop Hypertension." The DASH eating plan is a healthy eating plan that has   been shown to reduce high blood pressure (hypertension). Additional health benefits may include reducing the risk of type 2 diabetes mellitus, heart disease, and stroke. The DASH eating plan may also help with weight loss. WHAT DO I NEED TO KNOW ABOUT THE DASH EATING PLAN? For the DASH eating plan, you will follow these general guidelines:  Choose foods with a percent daily value for sodium of less than 5% (as listed on the food label).  Use salt-free seasonings or herbs instead of table salt or sea salt.  Check with your health care provider or pharmacist before using salt substitutes.  Eat lower-sodium products, often labeled as "lower sodium" or "no salt added."  Eat fresh foods.  Eat more vegetables, fruits, and low-fat dairy  products.  Choose whole grains. Look for the word "whole" as the first word in the ingredient list.  Choose fish and skinless chicken or turkey more often than red meat. Limit fish, poultry, and meat to 6 oz (170 g) each day.  Limit sweets, desserts, sugars, and sugary drinks.  Choose heart-healthy fats.  Limit cheese to 1 oz (28 g) per day.  Eat more home-cooked food and less restaurant, buffet, and fast food.  Limit fried foods.  Cook foods using methods other than frying.  Limit canned vegetables. If you do use them, rinse them well to decrease the sodium.  When eating at a restaurant, ask that your food be prepared with less salt, or no salt if possible. WHAT FOODS CAN I EAT? Seek help from a dietitian for individual calorie needs. Grains Whole grain or whole wheat bread. Brown rice. Whole grain or whole wheat pasta. Quinoa, bulgur, and whole grain cereals. Low-sodium cereals. Corn or whole wheat flour tortillas. Whole grain cornbread. Whole grain crackers. Low-sodium crackers. Vegetables Fresh or frozen vegetables (raw, steamed, roasted, or grilled). Low-sodium or reduced-sodium tomato and vegetable juices. Low-sodium or reduced-sodium tomato sauce and paste. Low-sodium or reduced-sodium canned vegetables.  Fruits All fresh, canned (in natural juice), or frozen fruits. Meat and Other Protein Products Ground beef (85% or leaner), grass-fed beef, or beef trimmed of fat. Skinless chicken or turkey. Ground chicken or turkey. Pork trimmed of fat. All fish and seafood. Eggs. Dried beans, peas, or lentils. Unsalted nuts and seeds. Unsalted canned beans. Dairy Low-fat dairy products, such as skim or 1% milk, 2% or reduced-fat cheeses, low-fat ricotta or cottage cheese, or plain low-fat yogurt. Low-sodium or reduced-sodium cheeses. Fats and Oils Tub margarines without trans fats. Light or reduced-fat mayonnaise and salad dressings (reduced sodium). Avocado. Safflower, olive, or canola  oils. Natural peanut or almond butter. Other Unsalted popcorn and pretzels. The items listed above may not be a complete list of recommended foods or beverages. Contact your dietitian for more options. WHAT FOODS ARE NOT RECOMMENDED? Grains White bread. White pasta. White rice. Refined cornbread. Bagels and croissants. Crackers that contain trans fat. Vegetables Creamed or fried vegetables. Vegetables in a cheese sauce. Regular canned vegetables. Regular canned tomato sauce and paste. Regular tomato and vegetable juices. Fruits Dried fruits. Canned fruit in light or heavy syrup. Fruit juice. Meat and Other Protein Products Fatty cuts of meat. Ribs, chicken wings, bacon, sausage, bologna, salami, chitterlings, fatback, hot dogs, bratwurst, and packaged luncheon meats. Salted nuts and seeds. Canned beans with salt. Dairy Whole or 2% milk, cream, half-and-half, and cream cheese. Whole-fat or sweetened yogurt. Full-fat cheeses or blue cheese. Nondairy creamers and whipped toppings. Processed cheese, cheese spreads, or cheese curds. Condiments Onion and garlic salt,   seasoned salt, table salt, and sea salt. Canned and packaged gravies. Worcestershire sauce. Tartar sauce. Barbecue sauce. Teriyaki sauce. Soy sauce, including reduced sodium. Steak sauce. Fish sauce. Oyster sauce. Cocktail sauce. Horseradish. Ketchup and mustard. Meat flavorings and tenderizers. Bouillon cubes. Hot sauce. Tabasco sauce. Marinades. Taco seasonings. Relishes. Fats and Oils Butter, stick margarine, lard, shortening, ghee, and bacon fat. Coconut, palm kernel, or palm oils. Regular salad dressings. Other Pickles and olives. Salted popcorn and pretzels. The items listed above may not be a complete list of foods and beverages to avoid. Contact your dietitian for more information. WHERE CAN I FIND MORE INFORMATION? National Heart, Lung, and Blood Institute: www.nhlbi.nih.gov/health/health-topics/topics/dash/ Document Released:  12/08/2010 Document Revised: 05/05/2013 Document Reviewed: 10/23/2012 ExitCare Patient Information 2015 [employer]. This information is not intended to replace advice given to you by your health care provider. Make sure you discuss any questions you have with your health care provider.  

## 2013-11-07 LAB — CBC WITH DIFFERENTIAL/PLATELET
BASOS ABS: 0 10*3/uL (ref 0.0–0.1)
BASOS PCT: 0 % (ref 0–1)
Eosinophils Absolute: 0.2 10*3/uL (ref 0.0–0.7)
Eosinophils Relative: 3 % (ref 0–5)
HCT: 38.7 % (ref 36.0–46.0)
Hemoglobin: 12.7 g/dL (ref 12.0–15.0)
Lymphocytes Relative: 33 % (ref 12–46)
Lymphs Abs: 2.3 10*3/uL (ref 0.7–4.0)
MCH: 26.1 pg (ref 26.0–34.0)
MCHC: 32.8 g/dL (ref 30.0–36.0)
MCV: 79.5 fL (ref 78.0–100.0)
Monocytes Absolute: 0.4 10*3/uL (ref 0.1–1.0)
Monocytes Relative: 6 % (ref 3–12)
NEUTROS ABS: 4.1 10*3/uL (ref 1.7–7.7)
NEUTROS PCT: 58 % (ref 43–77)
Platelets: 240 10*3/uL (ref 150–400)
RBC: 4.87 MIL/uL (ref 3.87–5.11)
RDW: 14.4 % (ref 11.5–15.5)
WBC: 7 10*3/uL (ref 4.0–10.5)

## 2013-11-07 LAB — COMPLETE METABOLIC PANEL WITH GFR
ALK PHOS: 99 U/L (ref 39–117)
ALT: 36 U/L — ABNORMAL HIGH (ref 0–35)
AST: 32 U/L (ref 0–37)
Albumin: 4.3 g/dL (ref 3.5–5.2)
BUN: 12 mg/dL (ref 6–23)
CO2: 25 mEq/L (ref 19–32)
Calcium: 9.2 mg/dL (ref 8.4–10.5)
Chloride: 106 mEq/L (ref 96–112)
Creat: 0.83 mg/dL (ref 0.50–1.10)
GFR, Est African American: >89 mL/min
GFR, Est Non African American: >89 mL/min
Glucose, Bld: 67 mg/dL — ABNORMAL LOW (ref 70–99)
POTASSIUM: 4 meq/L (ref 3.5–5.3)
Sodium: 138 mEq/L (ref 135–145)
Total Bilirubin: 0.5 mg/dL (ref 0.2–1.2)
Total Protein: 6.8 g/dL (ref 6.0–8.3)

## 2013-11-07 LAB — HEMOGLOBIN A1C
Hgb A1c MFr Bld: 6.2 % — ABNORMAL HIGH (ref <5.7)
MEAN PLASMA GLUCOSE: 131 mg/dL — AB (ref <117)

## 2013-11-07 LAB — TSH: TSH: 1.082 u[IU]/mL (ref 0.350–4.500)

## 2013-11-07 LAB — VITAMIN D 25 HYDROXY (VIT D DEFICIENCY, FRACTURES): VIT D 25 HYDROXY: 15 ng/mL — AB (ref 30–89)

## 2013-11-13 ENCOUNTER — Telehealth: Payer: Self-pay

## 2013-11-13 MED ORDER — VITAMIN D (ERGOCALCIFEROL) 1.25 MG (50000 UNIT) PO CAPS: 50000.0000 [IU] | ORAL_CAPSULE | ORAL | Status: DC

## 2013-11-13 NOTE — Telephone Encounter (Signed)
-----   Message from Doris Cheadleeepak Advani, MD sent at 11/07/2013 10:13 AM EST ----- Blood work reviewed, noticed low vitamin D, call patient advise to start ergocalciferol 50,000 units once a week for the duration of  12 weeks. Also  noticed hemoglobin A1c of 6.2%, patient has prediabetes, call and advise patient for low carbohydrate diet.

## 2013-11-13 NOTE — Telephone Encounter (Signed)
Patient is aware of her lab results and [facility]

## 2014-03-26 ENCOUNTER — Emergency Department (INDEPENDENT_AMBULATORY_CARE_PROVIDER_SITE_OTHER)
Admission: EM | Admit: 2014-03-26 | Discharge: 2014-03-26 | Disposition: A | Discharge status: Home / Self Care | Payer: 59 | Source: Home / Self Care | Attending: Family Medicine | Admitting: Family Medicine

## 2014-03-26 ENCOUNTER — Encounter (HOSPITAL_COMMUNITY): Payer: Self-pay | Admitting: Emergency Medicine

## 2014-03-26 DIAGNOSIS — R21 Rash and other nonspecific skin eruption: Secondary | ICD-10-CM | POA: Diagnosis not present

## 2014-03-26 MED ORDER — PREDNISONE 5 MG PO KIT: PACK | ORAL | Status: DC

## 2014-03-26 MED ORDER — CLOBETASOL PROPIONATE 0.05 % EX OINT: 1.0000 "application " | TOPICAL_OINTMENT | Freq: Two times a day (BID) | CUTANEOUS | Status: DC

## 2014-03-26 NOTE — Discharge Instructions (Signed)
Thank you for coming in today. Come back if you get worse.    Contact Dermatitis Contact dermatitis is a reaction to certain substances that touch the skin. Contact dermatitis can be either irritant contact dermatitis or allergic contact dermatitis. Irritant contact dermatitis does not require previous exposure to the substance for a reaction to occur.Allergic contact dermatitis only occurs if you have been exposed to the substance before. Upon a repeat exposure, your body reacts to the substance.  CAUSES  Many substances can cause contact dermatitis. Irritant dermatitis is most commonly caused by repeated exposure to mildly irritating substances, such as:  Makeup.  Soaps.  Detergents.  Bleaches.  Acids.  Metal salts, such as nickel. Allergic contact dermatitis is most commonly caused by exposure to:  Poisonous plants.  Chemicals (deodorants, shampoos).  Jewelry.  Latex.  Neomycin in triple antibiotic cream.  Preservatives in products, including clothing. SYMPTOMS  The area of skin that is exposed may develop:  Dryness or flaking.  Redness.  Cracks.  Itching.  Pain or a burning sensation.  Blisters. With allergic contact dermatitis, there may also be swelling in areas such as the eyelids, mouth, or genitals.  DIAGNOSIS  Your caregiver can usually tell what the problem is by doing a physical exam. In cases where the cause is uncertain and an allergic contact dermatitis is suspected, a patch skin test may be performed to help determine the cause of your dermatitis. TREATMENT Treatment includes protecting the skin from further contact with the irritating substance by avoiding that substance if possible. Barrier creams, powders, and gloves may be helpful. Your caregiver may also recommend:  Steroid creams or ointments applied 2 times daily. For best results, soak the rash area in cool water for 20 minutes. Then apply the medicine. Cover the area with a plastic wrap.  You can store the steroid cream in the refrigerator for a "chilly" effect on your rash. That may decrease itching. Oral steroid medicines may be needed in more severe cases.  Antibiotics or antibacterial ointments if a skin infection is present.  Antihistamine lotion or an antihistamine taken by mouth to ease itching.  Lubricants to keep moisture in your skin.  Burow's solution to reduce redness and soreness or to dry a weeping rash. Mix one packet or tablet of solution in 2 cups cool water. Dip a clean washcloth in the mixture, wring it out a bit, and put it on the affected area. Leave the cloth in place for 30 minutes. Do this as often as possible throughout the day.  Taking several cornstarch or baking soda baths daily if the area is too large to cover with a washcloth. Harsh chemicals, such as alkalis or acids, can cause skin damage that is like a burn. You should flush your skin for 15 to 20 minutes with cold water after such an exposure. You should also seek immediate medical care after exposure. Bandages (dressings), antibiotics, and pain medicine may be needed for severely irritated skin.  HOME CARE INSTRUCTIONS  Avoid the substance that caused your reaction.  Keep the area of skin that is affected away from hot water, soap, sunlight, chemicals, acidic substances, or anything else that would irritate your skin.  Do not scratch the rash. Scratching may cause the rash to become infected.  You may take cool baths to help stop the itching.  Only take over-the-counter or prescription medicines as directed by your caregiver.  See your caregiver for follow-up care as directed to make sure your skin is  healing properly. SEEK MEDICAL CARE IF:   Your condition is not better after 3 days of treatment.  You seem to be getting worse.  You see signs of infection such as swelling, tenderness, redness, soreness, or warmth in the affected area.  You have any problems related to your  medicines. Document Released: 12/17/1999 Document Revised: 03/13/2011 Document Reviewed: 05/24/2010 [facility] Patient Information 2015 [place], [place]. This information is not intended to replace advice given to you by your health care provider. Make sure you discuss any questions you have with your health care provider.

## 2014-03-26 NOTE — ED Notes (Signed)
Reports rash on back of neck x 2 weeks.  States "at some point it cleared but then came back but worse and with drainage".  Pt has used otc creams with no relief.

## 2014-03-26 NOTE — ED Provider Notes (Signed)
Julie [name] is a 29 y.o. female who presents to Urgent Care today for rash. Patient notes a rash on the neck occurring about the left and the right side. The rash has a clear discharge and has been worsening recently. She notes that sometimes painful and sometimes itchy. She denies any fevers or chills nausea vomiting or diarrhea. She has tried hydrocortisone, witch hazel, Neosporin, and Allegra which have all not help very much. She notes subjective fevers occurring off and on but denies any currently. No cough congestion and runny nose. No vomiting or diarrhea. No new soaps detergents shampoos or cosmetics. No new oral medications. She feels well otherwise.   Past Medical History  Diagnosis Date  . Heart murmur   . Migraines   . Migraine    Past Surgical History  Procedure Laterality Date  . Wisdom tooth extraction     History  Substance Use Topics  . Smoking status: Current Every Day Smoker  . Smokeless tobacco: Not on file  . Alcohol Use: Yes     Comment: socially    ROS as above Medications: No current facility-administered medications for this encounter.   Current Outpatient Prescriptions  Medication Sig Dispense Refill  . Aspirin-Salicylamide-Caffeine (BC HEADACHE POWDER PO) Take 1 Package by mouth daily as needed (for migraine or headache).    . clobetasol ointment (TEMOVATE) 3.01 % Apply 1 application topically 2 (two) times daily. 60 g 1  . ibuprofen (ADVIL,MOTRIN) 800 MG tablet Take 1 tablet (800 mg total) by mouth every 8 (eight) hours as needed. 30 tablet 1  . PredniSONE 5 MG KIT 12 day dosepack 1 kit 0  . SUMAtriptan (IMITREX) 100 MG tablet Take 1 tablet (100 mg total) by mouth every 2 (two) hours as needed for migraine or headache. May repeat in 2 hours if headache persists or recurs. 10 tablet 0  . Vitamin D, Ergocalciferol, (DRISDOL) 50000 UNITS CAPS capsule Take 1 capsule (50,000 Units total) by mouth every 7 (seven) days. 12 capsule 0   Allergies  Allergen  Reactions  . Acetaminophen Shortness Of Breath, Swelling and Rash  . Codeine Shortness Of Breath, Swelling, Rash and Other (See Comments)    Hands swell     Exam:  BP 133/84 mmHg  Pulse 96  Temp(Src) 98.1 F (36.7 C) (Oral)  Resp 14  SpO2 99% Gen: Well NAD Skin: Erythematous papular rash with clear discharge. Not particularly tender.      No results found for this or any previous visit (from the past 24 hour(s)). No results found.  Assessment and Plan: 29 y.o. female with dermatitis. Treat with prednisone and clobetasol ointment. Follow-up with PCP. Doubt infected.  Discussed warning signs or symptoms. Please see discharge instructions. Patient expresses understanding.     Gregor Hams, MD 03/26/14 [id number]

## 2014-06-15 ENCOUNTER — Ambulatory Visit (HOSPITAL_COMMUNITY): Payer: 59 | Attending: Cardiology

## 2014-06-15 ENCOUNTER — Other Ambulatory Visit (HOSPITAL_COMMUNITY): Payer: Self-pay | Admitting: Orthopedic Surgery

## 2014-06-15 DIAGNOSIS — M7989 Other specified soft tissue disorders: Secondary | ICD-10-CM

## 2014-06-17 ENCOUNTER — Encounter (HOSPITAL_COMMUNITY): Payer: 59

## 2014-09-21 ENCOUNTER — Telehealth: Payer: Self-pay | Admitting: Internal Medicine

## 2014-09-29 ENCOUNTER — Emergency Department (HOSPITAL_COMMUNITY)
Admission: EM | Admit: 2014-09-29 | Discharge: 2014-09-29 | Disposition: A | Discharge status: Home / Self Care | Payer: 59 | Attending: Emergency Medicine | Admitting: Emergency Medicine

## 2014-09-29 ENCOUNTER — Emergency Department (HOSPITAL_COMMUNITY): Payer: 59

## 2014-09-29 ENCOUNTER — Encounter (HOSPITAL_COMMUNITY): Payer: Self-pay | Admitting: Emergency Medicine

## 2014-09-29 DIAGNOSIS — R079 Chest pain, unspecified: Secondary | ICD-10-CM | POA: Diagnosis present

## 2014-09-29 DIAGNOSIS — J209 Acute bronchitis, unspecified: Secondary | ICD-10-CM | POA: Diagnosis not present

## 2014-09-29 DIAGNOSIS — Z720 Tobacco use: Secondary | ICD-10-CM | POA: Insufficient documentation

## 2014-09-29 DIAGNOSIS — G43909 Migraine, unspecified, not intractable, without status migrainosus: Secondary | ICD-10-CM | POA: Insufficient documentation

## 2014-09-29 DIAGNOSIS — Z79899 Other long term (current) drug therapy: Secondary | ICD-10-CM | POA: Diagnosis not present

## 2014-09-29 DIAGNOSIS — R011 Cardiac murmur, unspecified: Secondary | ICD-10-CM | POA: Diagnosis not present

## 2014-09-29 LAB — BASIC METABOLIC PANEL
Anion gap: 9 (ref 5–15)
BUN: 9 mg/dL (ref 6–20)
CALCIUM: 9.3 mg/dL (ref 8.9–10.3)
CO2: 22 mmol/L (ref 22–32)
Chloride: 104 mmol/L (ref 101–111)
Creatinine, Ser: 0.89 mg/dL (ref 0.44–1.00)
GFR calc Af Amer: >60 mL/min (ref >60)
Glucose, Bld: 166 mg/dL — ABNORMAL HIGH (ref 65–99)
Potassium: 4.2 mmol/L (ref 3.5–5.1)
SODIUM: 135 mmol/L (ref 135–145)

## 2014-09-29 LAB — CBC
HEMATOCRIT: 39.6 % (ref 36.0–46.0)
Hemoglobin: 13 g/dL (ref 12.0–15.0)
MCH: 25.9 pg — ABNORMAL LOW (ref 26.0–34.0)
MCHC: 32.8 g/dL (ref 30.0–36.0)
MCV: 78.9 fL (ref 78.0–100.0)
Platelets: 228 10*3/uL (ref 150–400)
RBC: 5.02 MIL/uL (ref 3.87–5.11)
RDW: 13.9 % (ref 11.5–15.5)
WBC: 9.2 10*3/uL (ref 4.0–10.5)

## 2014-09-29 LAB — I-STAT TROPONIN, ED: TROPONIN I, POC: 0 ng/mL (ref 0.00–0.08)

## 2014-09-29 LAB — TROPONIN I

## 2014-09-29 MED ORDER — PREDNISONE 10 MG PO TABS: 20.0000 mg | ORAL_TABLET | Freq: Two times a day (BID) | ORAL | Status: DC

## 2014-09-29 MED ORDER — BENZONATATE 200 MG PO CAPS: 200.0000 mg | ORAL_CAPSULE | Freq: Three times a day (TID) | ORAL | Status: DC | PRN

## 2014-09-29 MED ORDER — IBUPROFEN 400 MG PO TABS
600.0000 mg | ORAL_TABLET | Freq: Once | ORAL | Status: AC
  Administered 2014-09-29: 600 mg via ORAL
  Filled 2014-09-29: qty 2

## 2014-09-29 MED ORDER — ALBUTEROL SULFATE HFA 108 (90 BASE) MCG/ACT IN AERS: 1.0000 | INHALATION_SPRAY | Freq: Four times a day (QID) | RESPIRATORY_TRACT | Status: DC | PRN

## 2014-09-29 MED ORDER — AZITHROMYCIN 250 MG PO TABS: 250.0000 mg | ORAL_TABLET | Freq: Every day | ORAL | Status: DC

## 2014-09-29 MED ORDER — IPRATROPIUM-ALBUTEROL 0.5-2.5 (3) MG/3ML IN SOLN
3.0000 mL | Freq: Once | RESPIRATORY_TRACT | Status: AC
  Administered 2014-09-29: 3 mL via RESPIRATORY_TRACT
  Filled 2014-09-29: qty 3

## 2014-09-29 NOTE — ED Provider Notes (Signed)
CSN: [id number]     Arrival date & time 09/29/14  1352 History  By signing my name below, I, Gwenyth Ober, attest that this documentation has been prepared under the direction and in the presence of Kerrie Buffalo, NP.  Electronically Signed: Gwenyth Ober, ED Scribe. 09/29/2014. 5:52 PM.  Chief Complaint  Patient presents with  . Chest Pain  . Nasal Congestion  . Hoarse   The history is provided by the patient. No language interpreter was used.    HPI Comments: Julie [name] is a 29 y.o. female who presents to the Emergency Department complaining of constant, moderate chest congestion that started 3 weeks ago. Pt states sore throat, pain with swallowing, loss of voice, cough with yellow sputum, nasal congestion, bilateral ear pain, nausea, fever of 102 and HA as associated symptoms. She also notes chest tightness that started today and becomes worse with lying flat. Pt has tried Sudafed, Theraflu and Ibuprofen with some relief. She denies vomiting, abdominal pain, diarrhea and urinary symptoms. Hx of asthma as a child but has not had to use an inhaler in years.   Past Medical History  Diagnosis Date  . Heart murmur   . Migraines   . Migraine    Past Surgical History  Procedure Laterality Date  . Wisdom tooth extraction     Family History  Problem Relation Age of Onset  . Diabetes Mother   . Hypertension Mother   . Dementia Maternal Grandmother   . Parkinson's disease Maternal Grandmother   . Hypertension Maternal Grandfather   . Hypertension Paternal Aunt   . Cancer Paternal Grandfather    Social History  Substance Use Topics  . Smoking status: Current Every Day Smoker -- 0.50 packs/day  . Smokeless tobacco: None  . Alcohol Use: Yes     Comment: socially    OB History    Gravida Para Term Preterm AB TAB SAB Ectopic Multiple Living   Review of Systems  Constitutional: Positive for fever.  HENT: Positive for congestion, ear pain and voice change.    Respiratory: Positive for cough and chest tightness.   Cardiovascular: Positive for chest pain.  Gastrointestinal: Positive for nausea. Negative for vomiting and abdominal pain.  Genitourinary: Negative for dysuria and difficulty urinating.  Neurological: Positive for headaches.  All other systems reviewed and are negative.  Allergies  Acetaminophen and Codeine  Home Medications   Prior to Admission medications   Medication Sig Start Date End Date Taking? Authorizing Provider  albuterol (PROVENTIL HFA;VENTOLIN HFA) 108 (90 BASE) MCG/ACT inhaler Inhale 1-2 puffs into the lungs every 6 (six) hours as needed for wheezing or shortness of breath. 09/29/14   Hope Orlene Och, NP  Aspirin-Salicylamide-Caffeine (BC HEADACHE POWDER PO) Take 1 Package by mouth daily as needed (for migraine or headache).    Historical Provider, MD  azithromycin (ZITHROMAX) 250 MG tablet Take 1 tablet (250 mg total) by mouth daily. Take first 2 tablets together, then 1 every day until finished. 09/29/14   Hope Orlene Och, NP  benzonatate (TESSALON) 200 MG capsule Take 1 capsule (200 mg total) by mouth 3 (three) times daily as needed for cough. 09/29/14   Hope Orlene Och, NP  clobetasol ointment (TEMOVATE) 0.05 % Apply 1 application topically 2 (two) times daily. 03/26/14   Rodolph Bong, MD  ibuprofen (ADVIL,MOTRIN) 800 MG tablet Take 1 tablet (800 mg total) by mouth every 8 (eight) hours  as needed. 11/06/13   Doris Cheadle, MD  predniSONE (DELTASONE) 10 MG tablet Take 2 tablets (20 mg total) by mouth 2 (two) times daily with a meal. 09/29/14   Hope Orlene Och, NP  SUMAtriptan (IMITREX) 100 MG tablet Take 1 tablet (100 mg total) by mouth every 2 (two) hours as needed for migraine or headache. May repeat in 2 hours if headache persists or recurs. 10/18/13   Kristen N Ward, DO  Vitamin D, Ergocalciferol, (DRISDOL) 50000 UNITS CAPS capsule Take 1 capsule (50,000 Units total) by mouth every 7 (seven) days. 11/13/13   Doris Cheadle, MD   BP  128/71 mmHg  Pulse 88  Temp(Src) 98.1 F (36.7 C) (Oral)  Resp 16  SpO2 99%  LMP  Physical Exam  Constitutional: She is oriented to person, place, and time. She appears well-developed and well-nourished. No distress.  HENT:  Head: Normocephalic and atraumatic.  Right Ear: External ear normal.  Left Ear: External ear normal.  Nose: Mucosal edema and rhinorrhea present.  Mouth/Throat: Oropharynx is clear and moist.  Eyes: Conjunctivae and EOM are normal.  Neck: Normal range of motion. Neck supple.  Cardiovascular: Normal rate.   Pulmonary/Chest: Effort normal. No respiratory distress. She has wheezes.  Inspiratory wheezes bilaterally  Abdominal: Soft. There is no tenderness.  Musculoskeletal: Normal range of motion.  Lymphadenopathy:    She has no cervical adenopathy.  Neurological: She is alert and oriented to person, place, and time. No cranial nerve deficit.  Skin: Skin is warm and dry.  Psychiatric: She has a normal mood and affect. Her behavior is normal.  Nursing note and vitals reviewed.   ED Course  Procedures  CXR, DuoNeb, EKG DIAGNOSTIC STUDIES: Oxygen Saturation is 97% on RA, normal by my interpretation.    COORDINATION OF CARE: 4:12 PM Discussed lab and x-ray results with pt. Discussed treatment plan with pt which includes Ibuprofen and cough suppressant. She agreed to plan.  5:50 PM Pt feeling better after albuterol treatment. Lungs clear to ausculation bilaterally. Will order prednisone, albuterol inhaler, Azithromycin and Tessalon Pearls. Pt agreed to plan and has no further questions at this time.  Labs Review No results found for this or any previous visit (from the past 24 hour(s)).   Imaging Review Dg Chest 2 View  09/29/2014   CLINICAL DATA:  Chest pain. Initial encounter. Cough and congestion. Headache and body ache.  EXAM: CHEST  2 VIEW  COMPARISON:  10/27/2009.  FINDINGS: The heart size and mediastinal contours are within normal limits. Both lungs  are clear. The visualized skeletal structures are unremarkable.  IMPRESSION: No active cardiopulmonary disease.   Electronically Signed   By: Andreas Newport M.D.   On: 09/29/2014 14:52   I have personally reviewed and evaluated these images and lab results as part of my medical decision-making.  EKG: normal sinus rhythm with sinus arrhythmia .         Nonspecific T wave abnormality  Read by Frederick Peers, MD  MDM  29 y.o. female with cough, congestion and chest thighness x 3 days. Stable for d/c with normal chest x-ray, no respiratory distress, O2 SAT 99% on R/A at D/C. Will treat for bronchitis and bronchospasm with Albuterol Inhaler, Zithromax, Tessalon, and prednisone. Discussed with the patient and all questioned fully answered. She will follow up with her PCP or return  if any problems arise.   Final diagnoses:  Bronchitis with bronchospasm   I personally performed the services described in this documentation, which was  scribed in my presence. The recorded information has been reviewed and is accurate.   Janne Napoleon, NP 09/30/14 2020  Melene Plan, DO 09/30/14 1610

## 2014-09-29 NOTE — ED Notes (Signed)
Pt with 2-3 day hx of congestion, cough and hoarse voice. Pt states yesterday felt pressure in her chest. Pt awake, alert, oriented x4, VSS.

## 2014-09-29 NOTE — ED Notes (Signed)
Pt endorses diffuse cp only with coughing.

## 2015-03-13 ENCOUNTER — Encounter (HOSPITAL_COMMUNITY): Payer: Self-pay | Admitting: Emergency Medicine

## 2015-03-13 ENCOUNTER — Emergency Department (HOSPITAL_COMMUNITY)
Admission: EM | Admit: 2015-03-13 | Discharge: 2015-03-13 | Disposition: A | Discharge status: Home / Self Care | Payer: 59 | Attending: Emergency Medicine | Admitting: Emergency Medicine

## 2015-03-13 DIAGNOSIS — M79622 Pain in left upper arm: Secondary | ICD-10-CM | POA: Insufficient documentation

## 2015-03-13 DIAGNOSIS — F17200 Nicotine dependence, unspecified, uncomplicated: Secondary | ICD-10-CM | POA: Insufficient documentation

## 2015-03-13 DIAGNOSIS — R202 Paresthesia of skin: Secondary | ICD-10-CM | POA: Insufficient documentation

## 2015-03-13 DIAGNOSIS — R011 Cardiac murmur, unspecified: Secondary | ICD-10-CM | POA: Insufficient documentation

## 2015-03-13 NOTE — ED Notes (Signed)
Patient reports Thursday night the wiring in her bra cut her left upper arm. 2" abrasion noted to same with redness. Patient has BC in same place, c/o pain with intermittent tingling in arm. Equal grip. Brachial and radial pulses intact. Minimal relief with neosporin and ibuprofen.

## 2015-03-13 NOTE — ED Notes (Signed)
Pt has been called three times,  Pt did not respond to either call

## 2015-08-31 ENCOUNTER — Encounter (HOSPITAL_COMMUNITY): Payer: Self-pay | Admitting: *Deleted

## 2015-08-31 ENCOUNTER — Inpatient Hospital Stay (HOSPITAL_COMMUNITY)
Admission: AD | Admit: 2015-08-31 | Discharge: 2015-08-31 | Disposition: A | Discharge status: Home / Self Care | Payer: BLUE CROSS/BLUE SHIELD | Source: Ambulatory Visit | Attending: Obstetrics and Gynecology | Admitting: Obstetrics and Gynecology

## 2015-08-31 DIAGNOSIS — A499 Bacterial infection, unspecified: Secondary | ICD-10-CM

## 2015-08-31 DIAGNOSIS — N760 Acute vaginitis: Secondary | ICD-10-CM | POA: Diagnosis not present

## 2015-08-31 DIAGNOSIS — N920 Excessive and frequent menstruation with regular cycle: Secondary | ICD-10-CM | POA: Insufficient documentation

## 2015-08-31 DIAGNOSIS — Z8249 Family history of ischemic heart disease and other diseases of the circulatory system: Secondary | ICD-10-CM | POA: Insufficient documentation

## 2015-08-31 DIAGNOSIS — Z833 Family history of diabetes mellitus: Secondary | ICD-10-CM | POA: Diagnosis not present

## 2015-08-31 DIAGNOSIS — B9689 Other specified bacterial agents as the cause of diseases classified elsewhere: Secondary | ICD-10-CM

## 2015-08-31 DIAGNOSIS — N946 Dysmenorrhea, unspecified: Secondary | ICD-10-CM | POA: Insufficient documentation

## 2015-08-31 DIAGNOSIS — N921 Excessive and frequent menstruation with irregular cycle: Secondary | ICD-10-CM

## 2015-08-31 DIAGNOSIS — N72 Inflammatory disease of cervix uteri: Secondary | ICD-10-CM

## 2015-08-31 DIAGNOSIS — N939 Abnormal uterine and vaginal bleeding, unspecified: Secondary | ICD-10-CM

## 2015-08-31 DIAGNOSIS — Z888 Allergy status to other drugs, medicaments and biological substances status: Secondary | ICD-10-CM | POA: Insufficient documentation

## 2015-08-31 DIAGNOSIS — F17210 Nicotine dependence, cigarettes, uncomplicated: Secondary | ICD-10-CM | POA: Diagnosis not present

## 2015-08-31 DIAGNOSIS — Z885 Allergy status to narcotic agent status: Secondary | ICD-10-CM | POA: Diagnosis not present

## 2015-08-31 HISTORY — DX: Abnormal uterine and vaginal bleeding, unspecified: N93.9

## 2015-08-31 LAB — WET PREP, GENITAL
Sperm: NONE SEEN
Trich, Wet Prep: NONE SEEN
Yeast Wet Prep HPF POC: NONE SEEN

## 2015-08-31 LAB — URINALYSIS, ROUTINE W REFLEX MICROSCOPIC
Bilirubin Urine: NEGATIVE
Glucose, UA: NEGATIVE mg/dL
Ketones, ur: NEGATIVE mg/dL
Leukocytes, UA: NEGATIVE
NITRITE: NEGATIVE
Protein, ur: NEGATIVE mg/dL
SPECIFIC GRAVITY, URINE: 1.02 (ref 1.005–1.030)
pH: 5.5 (ref 5.0–8.0)

## 2015-08-31 LAB — URINE MICROSCOPIC-ADD ON

## 2015-08-31 LAB — CBC
HEMATOCRIT: 38.7 % (ref 36.0–46.0)
HEMOGLOBIN: 13.1 g/dL (ref 12.0–15.0)
MCH: 26.3 pg (ref 26.0–34.0)
MCHC: 33.9 g/dL (ref 30.0–36.0)
MCV: 77.7 fL — AB (ref 78.0–100.0)
Platelets: 251 10*3/uL (ref 150–400)
RBC: 4.98 MIL/uL (ref 3.87–5.11)
RDW: 14.6 % (ref 11.5–15.5)
WBC: 10.4 10*3/uL (ref 4.0–10.5)

## 2015-08-31 LAB — POCT PREGNANCY, URINE: PREG TEST UR: NEGATIVE

## 2015-08-31 MED ORDER — METRONIDAZOLE 500 MG PO TABS: 500.0000 mg | ORAL_TABLET | Freq: Two times a day (BID) | ORAL | 0 refills | Status: DC

## 2015-08-31 MED ORDER — IBUPROFEN 800 MG PO TABS: 800.0000 mg | ORAL_TABLET | Freq: Three times a day (TID) | ORAL | 0 refills | Status: DC

## 2015-08-31 MED ORDER — KETOROLAC TROMETHAMINE 60 MG/2ML IM SOLN
60.0000 mg | Freq: Once | INTRAMUSCULAR | Status: AC
  Administered 2015-08-31: 60 mg via INTRAMUSCULAR
  Filled 2015-08-31: qty 2

## 2015-08-31 MED ORDER — NORGESTIMATE-ETH ESTRADIOL 0.25-35 MG-MCG PO TABS: 1.0000 | ORAL_TABLET | Freq: Two times a day (BID) | ORAL | 11 refills | Status: DC

## 2015-08-31 MED ORDER — CEFTRIAXONE SODIUM 250 MG IJ SOLR
250.0000 mg | Freq: Once | INTRAMUSCULAR | Status: AC
  Administered 2015-08-31: 250 mg via INTRAMUSCULAR
  Filled 2015-08-31: qty 250

## 2015-08-31 MED ORDER — AZITHROMYCIN 250 MG PO TABS
1000.0000 mg | ORAL_TABLET | Freq: Once | ORAL | Status: AC
  Administered 2015-08-31: 1000 mg via ORAL
  Filled 2015-08-31: qty 4

## 2015-08-31 NOTE — MAU Provider Note (Signed)
History     CSN: [id number]  Arrival date and time: 08/31/15 1520   First Provider Initiated Contact with Patient 08/31/15 1616      Chief Complaint  Patient presents with  . Vaginal Bleeding   HPI  Pt is 30 y.o. not pregnant [id number] who presents for vaginal bleeding for 2 months, heavy at times.  [place] [facility],but has continued to bleed and has gotten heavier. Pt has taken ibuprofen with some improvement in pain, but has not taken any today. Pt has cramping associated with her bleeding. RN note: Obstetrics/Gynecology    [] Hide copied text [] Hover for attribution information Bleeding for almost 2 months, heavy at times.States periods have always been heavy, but not like this. Sometimes has to use a tampon and pad together. C/O abd cramping. States has nexplanon and has been in since 2015, [facility]       Past Medical History:  Diagnosis Date  . Abnormal vaginal bleeding   . Heart murmur   . Migraine   . Migraines     Past Surgical History:  Procedure Laterality Date  . WISDOM TOOTH EXTRACTION      Family History  Problem Relation Age of Onset  . Diabetes Mother   . Hypertension Mother   . Dementia Maternal Grandmother   . Parkinson's disease Maternal Grandmother   . Hypertension Maternal Grandfather   . Cancer Paternal Grandfather   . Hypertension Paternal Aunt     Social History  Substance Use Topics  . Smoking status: Current Every Day Smoker    Packs/day: 0.50  . Smokeless tobacco: Never Used  . Alcohol use Yes     Comment: socially     Allergies:  Allergies  Allergen Reactions  . Acetaminophen Shortness Of Breath, Swelling and Rash  . Codeine Shortness Of Breath, Swelling, Rash and Other (See Comments)    Hands swell  . Other Hives and Rash    All seafood     Prescriptions Prior to Admission  Medication Sig Dispense Refill Last Dose  . ibuprofen (ADVIL,MOTRIN) 200  MG tablet Take 400 mg by mouth every 6 (six) hours as needed for mild pain.   08/30/2015 at Unknown time    Review of Systems  Constitutional: Negative for chills and fever.  Gastrointestinal: Positive for abdominal pain. Negative for constipation, diarrhea, nausea and vomiting.  Genitourinary: Negative for dysuria and urgency.  Musculoskeletal: Negative for back pain.  Neurological: Positive for dizziness.   Physical Exam   Blood pressure 154/90, pulse 88, temperature 98.6 F (37 C), temperature source Oral, resp. rate 18, height 5' 7.5" (1.715 m), weight 230 lb (104.3 kg).  Physical Exam  Nursing note and vitals reviewed. Constitutional: She is oriented to person, place, and time. She appears well-developed and well-nourished. No distress.  HENT:  Head: Normocephalic.  Eyes: Pupils are equal, round, and reactive to light.  Neck: Normal range of motion. Neck supple.  Cardiovascular: Normal rate.   Respiratory: Effort normal.  GI: Soft. She exhibits no distension. There is no tenderness. There is no rebound and no guarding.  Genitourinary:  Genitourinary Comments: Small- mod amount of bright red blood in vault; cervix clean, mildly tender; uterus mildly tender; adnexa without palpable enlargement   Musculoskeletal: Normal range of motion.  Neurological: She is alert and oriented to person, place, and time.  Skin: Skin is warm and dry.  Psychiatric: She has a normal mood and affect.  MAU Course  Procedures Results for orders placed or performed during the hospital encounter of 08/31/15 (from the past 24 hour(s))  Urinalysis, Routine w reflex microscopic (not at [facility])     Status: Abnormal   Collection Time: 08/31/15  3:30 PM  Result Value Ref Range   Color, Urine YELLOW YELLOW   APPearance CLEAR CLEAR   Specific Gravity, Urine 1.020 1.005 - 1.030   pH 5.5 5.0 - 8.0   Glucose, UA NEGATIVE NEGATIVE mg/dL   Hgb urine dipstick LARGE (A) NEGATIVE   Bilirubin Urine NEGATIVE  NEGATIVE   Ketones, ur NEGATIVE NEGATIVE mg/dL   Protein, ur NEGATIVE NEGATIVE mg/dL   Nitrite NEGATIVE NEGATIVE   Leukocytes, UA NEGATIVE NEGATIVE  Urine microscopic-add on     Status: Abnormal   Collection Time: 08/31/15  3:30 PM  Result Value Ref Range   Squamous Epithelial / LPF 0-5 (A) NONE SEEN   WBC, UA 0-5 0 - 5 WBC/hpf   RBC / HPF 6-30 0 - 5 RBC/hpf   Bacteria, UA RARE (A) NONE SEEN  Pregnancy, urine POC     Status: None   Collection Time: 08/31/15  3:38 PM  Result Value Ref Range   Preg Test, Ur NEGATIVE NEGATIVE  CBC     Status: Abnormal   Collection Time: 08/31/15  4:54 PM  Result Value Ref Range   WBC 10.4 4.0 - 10.5 K/uL   RBC 4.98 3.87 - 5.11 MIL/uL   Hemoglobin 13.1 12.0 - 15.0 g/dL   HCT 16.1 09.6 - 04.5 %   MCV 77.7 (L) 78.0 - 100.0 fL   MCH 26.3 26.0 - 34.0 pg   MCHC 33.9 30.0 - 36.0 g/dL   RDW 40.9 81.1 - 91.4 %   Platelets 251 150 - 400 K/uL  Wet prep, genital     Status: Abnormal   Collection Time: 08/31/15  5:10 PM  Result Value Ref Range   Yeast Wet Prep HPF POC NONE SEEN NONE SEEN   Trich, Wet Prep NONE SEEN NONE SEEN   Clue Cells Wet Prep HPF POC PRESENT (A) NONE SEEN   WBC, Wet Prep HPF POC FEW (A) NONE SEEN   Sperm NONE SEEN   GC/chlamydia pending In view of pt's bleeding and tenderness, will treat for cervicitis Toradol 60mg  IM given with improvement in cramping  Assessment and Plan  AUB- sprintec BID for 2 weeks then one daily until f/u with [facility] Ibuprofen 800mg  #30 F/u with Mila Merry- pt to make appointment Cervicitis- Rx with Zithromax 1 gm PO and Rocephin 250mg IM in MAU BV- flagyl 500mg  BID for 7 days  Cal Gindlesperger 08/31/2015, 6:12 PM

## 2015-08-31 NOTE — MAU Note (Signed)
Bleeding for almost 2 months, heavy at times.States periods have always been heavy, but not like this. Sometimes has to use a tampon and pad together. C/O abd cramping. States has nexplanon and has been in since 2015[facility].

## 2015-08-31 NOTE — MAU Provider Note (Signed)
History    CSN: [id number]  Arrival date and time: 08/31/15 1520   First Provider Initiated Contact with Patient 08/31/15 1616      Chief Complaint  Patient presents with  . Vaginal Bleeding   Irregular Menstruation Julie [name] is a 30yo African American female who presents today complaining of heavy menstrual bleeding associated with cramping and fatigue.  She reports feeling "off" and "just not right". Her menstrual cycles have been irregular for as long as she can remember both in duration of menses and timing between menstrual cycles.  Duration of periods varies from 4 days to 2.5 weeks. The past 3 days she has been bleeding more heavily than normal and reports passing clots.  Pt reports blood soaking through underwear and frequent change of tampons.  She went through 6 pads yesterday.   UPT is negative today Menarche was at age 74.  The cramping pain is located in her lower abdomen and she also reports sharp pain in her lower back around the time of menses.   The patient has a Nexplanon in place since 2015 and has been considering having it removed. Patient reports headache, dizziness, lightheaded, nausea, vomiting, chills and fatigue.  Also reports blood in her urine yesterday.   Denies pain, burning with urination.  She denies fever, sore throat, diarrhea and generalized myalgias.       OB History    Gravida Para Term Preterm AB Living   1 1   1   1    SAB TAB Ectopic Multiple Live Births           1      Past Medical History:  Diagnosis Date  . Abnormal vaginal bleeding   . Heart murmur   . Migraine   . Migraines     Past Surgical History:  Procedure Laterality Date  . WISDOM TOOTH EXTRACTION      Family History  Problem Relation Age of Onset  . Diabetes Mother   . Hypertension Mother   . Dementia Maternal Grandmother   . Parkinson's disease Maternal Grandmother   . Hypertension Maternal Grandfather   . Cancer Paternal Grandfather   . Hypertension  Paternal Aunt     Social History  Substance Use Topics  . Smoking status: Current Every Day Smoker    Packs/day: 0.50  . Smokeless tobacco: Never Used  . Alcohol use Yes     Comment: socially     Allergies:  Allergies  Allergen Reactions  . Acetaminophen Shortness Of Breath, Swelling and Rash  . Codeine Shortness Of Breath, Swelling, Rash and Other (See Comments)    Hands swell    Prescriptions Prior to Admission  Medication Sig Dispense Refill Last Dose  . ibuprofen (ADVIL,MOTRIN) 200 MG tablet Take 400 mg by mouth every 6 (six) hours as needed for mild pain.   08/30/2015 at Unknown time    Review of Systems  Constitutional: Positive for chills and malaise/fatigue. Negative for fever.  HENT: Negative for sore throat.   Eyes: Negative for blurred vision.  Respiratory: Positive for cough.   Gastrointestinal: Positive for abdominal pain, nausea and vomiting. Negative for blood in stool and diarrhea.  Genitourinary: Positive for hematuria. Negative for dysuria.  Musculoskeletal: Positive for back pain. Negative for myalgias.  Skin: Negative for itching and rash.  Neurological: Positive for dizziness, weakness and headaches.  Endo/Heme/Allergies: Does not bruise/bleed easily.   Physical Exam   Blood pressure 154/90, pulse 88, temperature 98.6 F (37 C), temperature source  Oral, resp. rate 18, height 5' 7.5" (1.715 m), weight 104.3 kg (230 lb).  Physical Exam  Constitutional: She is oriented to person, place, and time. She appears well-developed and well-nourished. No distress.  HENT:  Head: Normocephalic.  Eyes: EOM are normal.  Neck: Normal range of motion. Neck supple.  Cardiovascular: Normal rate, regular rhythm and normal heart sounds.   Respiratory: Effort normal and breath sounds normal. No respiratory distress. She has no wheezes.  GI: Soft. Bowel sounds are normal. She exhibits no distension. There is generalized tenderness. There is no rebound, no guarding, no  CVA tenderness, no tenderness at McBurney's point and negative Murphy's sign.  Genitourinary: Uterus normal. There is no lesion on the right labia. There is no lesion on the left labia. Cervix exhibits friability. There is bleeding in the vagina.  Neurological: She is alert and oriented to person, place, and time.  Skin: Skin is warm and dry. No rash noted. She is not diaphoretic. No erythema.  Psychiatric: She has a normal mood and affect. Her behavior is normal.   Results for orders placed or performed during the hospital encounter of 08/31/15 (from the past 24 hour(s))  Urinalysis, Routine w reflex microscopic (not at [facility])     Status: Abnormal   Collection Time: 08/31/15  3:30 PM  Result Value Ref Range   Color, Urine YELLOW YELLOW   APPearance CLEAR CLEAR   Specific Gravity, Urine 1.020 1.005 - 1.030   pH 5.5 5.0 - 8.0   Glucose, UA NEGATIVE NEGATIVE mg/dL   Hgb urine dipstick LARGE (A) NEGATIVE   Bilirubin Urine NEGATIVE NEGATIVE   Ketones, ur NEGATIVE NEGATIVE mg/dL   Protein, ur NEGATIVE NEGATIVE mg/dL   Nitrite NEGATIVE NEGATIVE   Leukocytes, UA NEGATIVE NEGATIVE  Urine microscopic-add on     Status: Abnormal   Collection Time: 08/31/15  3:30 PM  Result Value Ref Range   Squamous Epithelial / LPF 0-5 (A) NONE SEEN   WBC, UA 0-5 0 - 5 WBC/hpf   RBC / HPF 6-30 0 - 5 RBC/hpf   Bacteria, UA RARE (A) NONE SEEN  Pregnancy, urine POC     Status: None   Collection Time: 08/31/15  3:38 PM  Result Value Ref Range   Preg Test, Ur NEGATIVE NEGATIVE  CBC     Status: Abnormal   Collection Time: 08/31/15  4:54 PM  Result Value Ref Range   WBC 10.4 4.0 - 10.5 K/uL   RBC 4.98 3.87 - 5.11 MIL/uL   Hemoglobin 13.1 12.0 - 15.0 g/dL   HCT 30.838.7 65.736.0 - 84.646.0 %   MCV 77.7 (L) 78.0 - 100.0 fL   MCH 26.3 26.0 - 34.0 pg   MCHC 33.9 30.0 - 36.0 g/dL   RDW 96.214.6 95.211.5 - 84.115.5 %   Platelets 251 150 - 400 K/uL  Wet prep, genital     Status: Abnormal   Collection Time: 08/31/15  5:10 PM  Result  Value Ref Range   Yeast Wet Prep HPF POC NONE SEEN NONE SEEN   Trich, Wet Prep NONE SEEN NONE SEEN   Clue Cells Wet Prep HPF POC PRESENT (A) NONE SEEN   WBC, Wet Prep HPF POC FEW (A) NONE SEEN   Sperm NONE SEEN     MAU Course  Procedures  MDM CBC obtained to r/o anemia, infection.  GC/Chlamydia and wet prep were obtained to assess for vaginal infection.  Assessment and Plan  1. Dysmenorrhea 2. Menorrhagia See Pamelia HoitSusan Tinna Kolker, NP note  Richard Godine 08/31/2015, 4:44 PM

## 2015-09-01 LAB — HIV ANTIBODY (ROUTINE TESTING W REFLEX): HIV SCREEN 4TH GENERATION: NONREACTIVE

## 2015-09-01 LAB — GC/CHLAMYDIA PROBE AMP (~~LOC~~) NOT AT ARMC
Chlamydia: NEGATIVE
Neisseria Gonorrhea: NEGATIVE

## 2015-09-16 ENCOUNTER — Ambulatory Visit: Payer: 59 | Admitting: Obstetrics and Gynecology

## 2015-11-24 ENCOUNTER — Ambulatory Visit (INDEPENDENT_AMBULATORY_CARE_PROVIDER_SITE_OTHER): Payer: Worker's Compensation | Admitting: Orthopedic Surgery

## 2015-11-24 ENCOUNTER — Encounter (INDEPENDENT_AMBULATORY_CARE_PROVIDER_SITE_OTHER): Payer: Self-pay | Admitting: Orthopedic Surgery

## 2015-11-24 VITALS — Ht 67.0 in | Wt 230.0 lb

## 2015-11-24 DIAGNOSIS — M25562 Pain in left knee: Secondary | ICD-10-CM | POA: Insufficient documentation

## 2015-11-24 DIAGNOSIS — M545 Low back pain, unspecified: Secondary | ICD-10-CM | POA: Insufficient documentation

## 2015-11-24 NOTE — Progress Notes (Signed)
Office Visit Note   Patient: Julie [name]           Date of Birth: [birth year]           MRN: [id number] Visit Date: 11/24/2015              Requested by: No referring provider defined for this encounter. PCP: Doris Cheadleeepak Advani, MD   Assessment & Plan: Visit Diagnoses:  1. Acute midline low back pain without sciatica   2. Acute pain of left knee     Plan: We'll request from [employer] an MRI scan of her lumbar spine. Patient has no radicular symptoms and of note she states she's fallen out of bed several times since her initial injury. I do not see any indication for intervention for the left knee she has had injections and has undergone physical therapy WITHOUT relief. I have encouraged her to continue strengthening and anti-inflammatories to improve her knee symptoms. Patient is currently at work.  Follow-Up Instructions: Return if symptoms worsen or fail to improve.   Orders:  No orders of the defined types were placed in this encounter.  No orders of the defined types were placed in this encounter.     Procedures: No procedures performed   Clinical Data: No additional findings.   Subjective: Chief Complaint  Patient presents with  . Left Ankle - Pain  . Left Knee - Pain    Patient is here for left ankle and left knee pain. Patient had on the job injury occurring on 06/03/2015 when squatting to pick up dual monitor computer. She heard a pop in left leg. She sat down approximately 1 hour. She went to stand back up and was unable to weight bear on left ankle and had acute onset swelling left knee. She has no relief with any conservative therapy. She has tried physical therapy without relief. She has no relief with icing or heat. She has had MRI of left knee. She complains of continuous swelling left knee. She has difficulty with prolonged ambulation, difficulty with start up. She ambulates with a cane today.   Patient states he has also fallen out of bed  several times since her injury and has been having some lower back pain. She is been to a chiropractor who told her she had some rotational changes in her back.  Review of Systems   Objective: Vital Signs: Ht 5\' 7"  (1.702 m)   Wt 230 lb (104.3 kg)   BMI 36.02 kg/m   Physical Exam on examination patient is alert oriented no adenopathy well-dressed normal affect normal respiratory effort she ambulates with a cane. Examination she has no pain with range of motion of the hip knee or ankle. She has a negative straight leg raise no focal motor weakness in either lower extremity. There is no pain with rotation of the hip. Patient does have global tenderness to palpation around the left knee Clausen cruciates are stable there is no effusion she is tender to palpation medially laterally on the joint line as well as tender to palpation the patellofemoral joint. Patient states that she has fluid in her knee that radiates from the knee up and down her thigh. I reviewed her MRI scan which shows no acute trauma no effusion she does have some mild chondromalacia.  Ortho Exam  Specialty Comments:  No specialty comments available.  Imaging: No results found.   PMFS History: Patient Active Problem List   Diagnosis Date Noted  . Acute midline  low back pain without sciatica 11/24/2015  . Acute pain of left knee 11/24/2015  . Smoking 11/06/2013  . Elevated BP 11/06/2013  . Migraine without aura 03/26/2012  . Insomnia 03/26/2012  . Obesity (BMI 30-39.9) 03/26/2012  . 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 Abnormal vaginal bleeding   . Heart murmur   . Migraine   . Migraines     Family History  Problem Relation Age of Onset  . Diabetes Mother   . Hypertension Mother   . Dementia Maternal Grandmother   . Parkinson's disease Maternal Grandmother   . Hypertension Maternal Grandfather   . Cancer Paternal Grandfather   . Hypertension Paternal Aunt       Past Surgical History:  Procedure Laterality Date  . WISDOM TOOTH EXTRACTION     Social History   Occupational History  . Not on file.   Social History Main Topics  . Smoking status: Current Every Day Smoker    Packs/day: 0.50  . Smokeless tobacco: Never Used  . Alcohol use Yes     Comment: socially   . Drug use: No  . Sexual activity: Yes    Partners: Male    Birth control/ protection: Implant

## 2016-01-04 ENCOUNTER — Emergency Department (HOSPITAL_COMMUNITY)
Admission: EM | Admit: 2016-01-04 | Discharge: 2016-01-04 | Disposition: A | Discharge status: Home / Self Care | Payer: BLUE CROSS/BLUE SHIELD | Attending: Emergency Medicine | Admitting: Emergency Medicine

## 2016-01-04 ENCOUNTER — Encounter (HOSPITAL_COMMUNITY): Payer: Self-pay

## 2016-01-04 DIAGNOSIS — F17200 Nicotine dependence, unspecified, uncomplicated: Secondary | ICD-10-CM | POA: Insufficient documentation

## 2016-01-04 DIAGNOSIS — G8929 Other chronic pain: Secondary | ICD-10-CM | POA: Insufficient documentation

## 2016-01-04 DIAGNOSIS — M25562 Pain in left knee: Secondary | ICD-10-CM | POA: Diagnosis not present

## 2016-01-04 DIAGNOSIS — M545 Low back pain: Secondary | ICD-10-CM | POA: Insufficient documentation

## 2016-01-04 MED ORDER — IBUPROFEN 800 MG PO TABS: 800.0000 mg | ORAL_TABLET | Freq: Four times a day (QID) | ORAL | 0 refills | Status: DC | PRN

## 2016-01-04 MED ORDER — KETOROLAC TROMETHAMINE 60 MG/2ML IM SOLN
60.0000 mg | Freq: Once | INTRAMUSCULAR | Status: AC
  Administered 2016-01-04: 60 mg via INTRAMUSCULAR
  Filled 2016-01-04: qty 2

## 2016-01-04 NOTE — Discharge Instructions (Signed)
Please follow-up with your orthopedic tomorrow. Please take ibuprofen every 6-8 hours as needed for pain. Use cold/warm compresses. Make sure to use the cane that was given to by her orthopedic to maintain proper gait.  Contact a health care provider if: Your pain increases, and medicine does not help. Your joint pain does not improve within 3 days. You have increased bruising or swelling. You have a fever. You lose 10 lb (4.5 kg) or more without trying. Get help right away if: You are not able to move the joint. Your fingers or toes become numb or they turn cold and blue.

## 2016-01-04 NOTE — ED Provider Notes (Signed)
[facility]-EMERGENCY DEPT Provider Note   CSN: [id number] Arrival date & time: 01/04/16  1204  By signing my name below, I, Julie [name], attest that this documentation has been prepared under the direction and in the presence of Julie HarpFrank Oktober Glazer, PA-C Electronically Signed: Soijett [name], ED Scribe. 01/04/16. 3:30 PM.  History   Chief Complaint Chief Complaint  Patient presents with  . Back Pain    HPI Julie [name] is a 31 y.o. female who presents to the Emergency Department complaining of stabbing, intermittent, bilateral lower back pain onset 7 months ago worsening last night. Pt notes that her lower back pain originates from her left knee and radiates to her left hip and upper back. Pt lower back pain is worsened with movement. Pt notes that she has been evaluated by an orthopedist for similar symptoms 2 months ago. Pt is currently awaiting approval for an MRI of her lumbar spine. She is having associated symptoms of tingling sensation to left upper leg, left knee pain, left knee swelling, left hip pain, gait problem due to pain, and chills. She has tried 6 tablets of OTC 200 mg ibuprofen, elevation, and alternating ice and heat with no relief for her symptoms. Pt denies fever, CP, SOB, vision change, abdominal pain, nausea, vomiting, history of cancer, IVDU, and any other symptoms. Denies having a PCP at this time.    Per pt chart review: Pt was evaluated by Orthopedist, Dr. Lajoyce Cornersuda on 11/24/2015 for f/u from a work injury involving her lower back and left knee. Pt had a MRI of her left knee that resulted as normal. Pt is awaiting approval for a MRI of her lumbar spine.   The history is provided by the patient. No language interpreter was used.    Past Medical History:  Diagnosis Date  . Abnormal vaginal bleeding   . Heart murmur   . Migraine   . Migraines     Patient Active Problem List   Diagnosis Date Noted  . Acute midline low back pain without sciatica 11/24/2015  . Acute pain of  left knee 11/24/2015  . Smoking 11/06/2013  . Elevated BP 11/06/2013  . Migraine without aura 03/26/2012  . Insomnia 03/26/2012  . Obesity (BMI 30-39.9) 03/26/2012  . 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 WISDOM TOOTH EXTRACTION      OB History    Gravida Para Term Preterm AB Living   1 1   1   1    SAB TAB Ectopic Multiple Live Births           1       Home Medications    Prior to Admission medications   Medication Sig Start Date End Date Taking? Authorizing Provider  ibuprofen (ADVIL,MOTRIN) 800 MG tablet Take 1 tablet (800 mg total) by mouth every 6 (six) hours as needed. 01/04/16   Julie [name] Julie [name], [place]  metroNIDAZOLE (FLAGYL) 500 MG tablet Take 1 tablet (500 mg total) by mouth 2 (two) times daily. 08/31/15   Jean RosenthalSusan P Lineberry, NP  norgestimate-ethinyl estradiol (ORTHO-CYCLEN,SPRINTEC,PREVIFEM) 0.25-35 MG-MCG tablet Take 1 tablet by mouth 2 (two) times daily. Take 2 times daily for 2 weeks then one daily 08/31/15   Jean RosenthalSusan P Lineberry, NP    Family History Family History  Problem Relation Age of Onset  . Diabetes Mother   . Hypertension Mother   . Dementia Maternal Grandmother   . Parkinson's disease Maternal Grandmother   . Hypertension Maternal  Grandfather   . Cancer Paternal Grandfather   . Hypertension Paternal Aunt     Social History Social History  Substance Use Topics  . Smoking status: Current Every Day Smoker    Packs/day: 0.50  . Smokeless tobacco: Never Used  . Alcohol use Yes     Comment: socially      Allergies   Acetaminophen; Codeine; and Other   Review of Systems Review of Systems  Constitutional: Positive for chills. Negative for fever.  Eyes: Negative for visual disturbance.  Respiratory: Negative for shortness of breath.   Cardiovascular: Negative for chest pain.  Gastrointestinal: Negative for abdominal pain, nausea and vomiting.  Musculoskeletal: Positive for arthralgias  (left knee and left hip), back pain, gait problem (due to pain) and joint swelling (left knee).  Neurological:       Tingling sensation to left upper leg   Physical Exam Updated Vital Signs BP 120/70 (BP Location: Left Arm)   Pulse 80   Temp 97.5 F (36.4 C) (Oral)   Resp 18   Ht 5\' 7"  (1.702 m)   Wt 55.8 kg   SpO2 100%   BMI 19.26 kg/m   Physical Exam  Constitutional: She is oriented to person, place, and time. She appears well-developed and well-nourished. No distress.  HENT:  Head: Normocephalic and atraumatic.  Eyes: Conjunctivae and EOM are normal. Pupils are equal, round, and reactive to light.  Neck: Normal range of motion. Neck supple.  Cardiovascular: Normal rate, regular rhythm and normal heart sounds.  Exam reveals no gallop and no friction rub.   No murmur heard. Pulmonary/Chest: Effort normal and breath sounds normal. No respiratory distress. She has no wheezes. She has no rales.  Abdominal: Soft. She exhibits no distension. There is no tenderness.  Musculoskeletal:       Left knee: She exhibits normal range of motion, no swelling and no erythema. No tenderness found.       Cervical back: She exhibits no tenderness and no bony tenderness.       Thoracic back: She exhibits no tenderness and no bony tenderness.       Lumbar back: She exhibits decreased range of motion, tenderness and bony tenderness. She exhibits no edema.       Left lower leg: She exhibits no tenderness.  Left knee without tenderness or erythema. Mild-to-moderate edema to left knee. Full active and passive ROM. Negative anterior and posterior drawers test. Negative varus and valgus movement. Negative McMurrays test. Negative Lachman test. Sensory intact. Muscle strength 5/5. No calf tenderness. Intact distal pulses.  Lumbar spine with decreased ROM. No erythema or edema noted. TTP to midline lumbar and paraspinal muscles. No thoracic or cervical spinal or paraspinal tenderness.   Neurological: She is  alert and oriented to person, place, and time. She has normal strength. No sensory deficit. Coordination and gait normal.  Able to ambulate with difficulty secondary to pain. Strength to BUE and BLE 5/5. Sensation intact. Rapid arm movement negative. Pronator drift negative. Heel-to-shin test negative. Finger-to-nose test negative.   Cranial Nerves:  III,IV, VI: ptosis not present, extra-ocular movements intact bilaterally, direct and consensual pupillary light reflexes intact bilaterally V: facial sensation, jaw opening, and bite strength equal bilaterally VII: eyebrow raise, eyelid close, smile, frown, pucker equal bilaterally VIII: hearing grossly normal bilaterally  IX,X: palate elevation and swallowing intact XI: bilateral shoulder shrug and lateral head rotation equal and strong XII: midline tongue extension  Skin: Skin is warm and dry.  Psychiatric: She has  a normal mood and affect. Her behavior is normal.  Nursing note and vitals reviewed.    ED Treatments / Results  DIAGNOSTIC STUDIES: Oxygen Saturation is 98% on RA, nl by my interpretation.    COORDINATION OF CARE: 3:19 PM Discussed treatment plan with pt at bedside which includes toradol injection and follow up with orthopedist and pt agreed to plan.    Procedures Procedures (including critical care time)  Medications Ordered in ED Medications  ketorolac (TORADOL) injection 60 mg (60 mg Intramuscular Given 01/04/16 1530)     Initial Impression / Assessment and Plan / ED Course  I have reviewed the triage vital signs and the nursing notes.  Clinical Course   Patient is a 31 y/o female with recent history of knee pain and low back pain currently being followed by her orthopedic after work injury. On exam patient afebrile, VSS.  Normal neurological exam. Sensation intact throughout. Muscle strength 5/5 bilaterally upper and lower extremities.  Able to ambulate with difficulty secondary to pain. Abdomen soft/benign, no  palpable pulsatile mass.  No warning symptoms of back pain including: fecal incontinence, urinary retention or overflow incontinence, night sweats, waking from sleep with back pain, unexplained fevers or weight loss, h/o cancer, IVDU, recent trauma. No concern for cauda equina, epidural abscess, or other serious cause of back pain.  Left knee mildly edematous, no erythema. Joint cool to the touch bilaterally. Knee mostly non tender to palpation. Lateral aspect of left knee tender to palpation. Patient afebrile, with no redness to sight. Low suspicion for septic arthritis, dislocation or fracture at this time.  Conservative measures such as rest, ice/heat and pain medicine indicated with orthopedic follow-up. Return precautions given for new or worsening symptoms.    Final Clinical Impressions(s) / ED Diagnoses   Final diagnoses:  Chronic pain of left knee  Acute low back pain, unspecified back pain laterality, with sciatica presence unspecified    New Prescriptions Discharge Medication List as of 01/04/2016  4:04 PM     I personally performed the services described in this documentation, which was scribed in my presence. The recorded information has been reviewed and is accurate.     [address] [place], [place] 01/04/16 1811    Geoffery Lyons, MD 01/04/16 2027

## 2016-01-04 NOTE — ED Triage Notes (Signed)
Pt presents for evaluation of back pain with radiation to L leg. Pt reports tingling intermittently to L leg. Pt reports has been taking ibuprofen for pain. Pt states has been going on intermittently x 6 months. Pt ambulatory, AxO x4. Pt states pain worse with movement.

## 2016-01-04 NOTE — ED Notes (Signed)
Patient complains of uncontrolled chronic pain since a work injury 6 months ago.

## 2016-12-18 ENCOUNTER — Ambulatory Visit (HOSPITAL_COMMUNITY)
Admission: EM | Admit: 2016-12-18 | Discharge: 2016-12-18 | Disposition: A | Discharge status: Home / Self Care | Payer: Self-pay | Attending: Urgent Care | Admitting: Urgent Care

## 2016-12-18 ENCOUNTER — Encounter (HOSPITAL_COMMUNITY): Payer: Self-pay | Admitting: Emergency Medicine

## 2016-12-18 DIAGNOSIS — J029 Acute pharyngitis, unspecified: Secondary | ICD-10-CM

## 2016-12-18 DIAGNOSIS — R0789 Other chest pain: Secondary | ICD-10-CM

## 2016-12-18 DIAGNOSIS — B9789 Other viral agents as the cause of diseases classified elsewhere: Secondary | ICD-10-CM

## 2016-12-18 DIAGNOSIS — J069 Acute upper respiratory infection, unspecified: Secondary | ICD-10-CM

## 2016-12-18 MED ORDER — BENZONATATE 100 MG PO CAPS: 100.0000 mg | ORAL_CAPSULE | Freq: Three times a day (TID) | ORAL | 0 refills | Status: DC | PRN

## 2016-12-18 MED ORDER — PSEUDOEPHEDRINE HCL ER 120 MG PO TB12: 120.0000 mg | ORAL_TABLET | Freq: Two times a day (BID) | ORAL | 3 refills | Status: DC

## 2016-12-18 NOTE — Discharge Instructions (Signed)
You may take ibuprofen 400-600mg  every 6 hours for chest and throat pain and inflammation. For sore throat try using a honey-based tea. Use 3 teaspoons of honey with juice squeezed from half lemon. Place shaved pieces of ginger into 1/2-1 cup of water and warm over stove top. Then mix the ingredients and repeat every 4 hours as needed.

## 2016-12-18 NOTE — ED Triage Notes (Signed)
Pt here with URI sx and cough and pain with cough and body aches

## 2016-12-18 NOTE — ED Provider Notes (Signed)
MRN: [id number] DOB: 01-23-1985  Subjective:   Julie [name] is a 31 y.o. female presenting for 3 day history of productive cough that elicits chest pain, shob. Has also had sore throat, difficulty swallowing, subjective fever, chills, sinus congestion, right ear pain. Has tried otc cough, cold medications. Denies n/v, abdominal pain, rashes. Has a history of allergies, takes Claritin, Xyzal but not using this now. Denies history of asthma. Smokes 3-4 cigarettes per day.   Julie [name] is not currently taking medications apart from those listed above and is allergic to acetaminophen; codeine; hydrocodone; and other.  Julie [name]  has a past medical history of Abnormal vaginal bleeding, Heart murmur, Migraine, and Migraines. Also  has a past surgical history that includes Wisdom tooth extraction.  Objective:   Vitals: BP 126/90 (BP Location: Right Arm)   Pulse 94   Temp 98.3 F (36.8 C) (Oral)   Resp 18   SpO2 100%   Physical Exam  Constitutional: She is oriented to person, place, and time. She appears well-developed and well-nourished.  HENT:  TM's intact bilaterally, no effusions or erythema. Nasal turbinates pink and moist, nasal passages patent. Oropharynx clear, mucous membranes moist.   Eyes: Right eye exhibits no discharge. Left eye exhibits no discharge.  Neck: Normal range of motion. Neck supple.  Cardiovascular: Normal rate, regular rhythm and intact distal pulses. Exam reveals no gallop and no friction rub.  No murmur heard. Pulmonary/Chest: No respiratory distress. She has no wheezes. She has no rales.  Neurological: She is alert and oriented to person, place, and time.  Skin: Skin is warm.  Psychiatric: She has a normal mood and affect.   Assessment and Plan :   Viral URI with cough  Atypical chest pain  Sore throat  Will use supportive care for viral illness. Restart allergy medications. Hold off on smoking while ill. Return-to-clinic precautions discussed, patient  verbalized understanding.       Julie [name], [place] 12/18/16 4098

## 2017-11-01 ENCOUNTER — Other Ambulatory Visit: Payer: Self-pay

## 2017-11-01 ENCOUNTER — Encounter (HOSPITAL_COMMUNITY): Payer: Self-pay

## 2017-11-01 ENCOUNTER — Emergency Department (HOSPITAL_COMMUNITY)
Admission: EM | Admit: 2017-11-01 | Discharge: 2017-11-02 | Disposition: A | Discharge status: Home / Self Care | Payer: BLUE CROSS/BLUE SHIELD | Attending: Emergency Medicine | Admitting: Emergency Medicine

## 2017-11-01 ENCOUNTER — Emergency Department (HOSPITAL_COMMUNITY): Payer: BLUE CROSS/BLUE SHIELD

## 2017-11-01 DIAGNOSIS — F17200 Nicotine dependence, unspecified, uncomplicated: Secondary | ICD-10-CM | POA: Insufficient documentation

## 2017-11-01 DIAGNOSIS — Z79899 Other long term (current) drug therapy: Secondary | ICD-10-CM | POA: Insufficient documentation

## 2017-11-01 DIAGNOSIS — J040 Acute laryngitis: Secondary | ICD-10-CM | POA: Diagnosis not present

## 2017-11-01 DIAGNOSIS — J029 Acute pharyngitis, unspecified: Secondary | ICD-10-CM | POA: Diagnosis present

## 2017-11-01 MED ORDER — BENZONATATE 100 MG PO CAPS: 100.0000 mg | ORAL_CAPSULE | Freq: Three times a day (TID) | ORAL | 0 refills | Status: DC

## 2017-11-01 MED ORDER — DEXAMETHASONE 4 MG PO TABS: 4.0000 mg | ORAL_TABLET | Freq: Two times a day (BID) | ORAL | 0 refills | Status: DC

## 2017-11-01 NOTE — ED Notes (Signed)
Patient verbalizes understanding of discharge instructions. Opportunity for questioning and answers were provided. Armband removed by staff, pt discharged from ED.  

## 2017-11-01 NOTE — ED Triage Notes (Signed)
Pt states generalized body aches, cough, sore throat, episode of vomiting and diarrhea yesterday. Pt has very hoarse voice. Reports taking meds for sx without improvement.

## 2017-11-02 NOTE — ED Provider Notes (Signed)
[facility] EMERGENCY DEPARTMENT Provider Note   CSN: [id number] Arrival date & time: 11/01/17  6160     History   Chief Complaint Chief Complaint  Patient presents with  . URI    HPI Julie [name] is a 32 y.o. female.  HPI   31yF with URI symptoms. Cough. Sore throat. Loss of voice. Feels congested. No improvement with otc meds. Began about 2d ago. Yesterday with n/v/d which has stopped. No abdominal pain. No urinary complaints.   Past Medical History:  Diagnosis Date  . Abnormal vaginal bleeding   . Heart murmur   . Migraine   . Migraines     Patient Active Problem List   Diagnosis Date Noted  . Acute midline low back pain without sciatica 11/24/2015  . Acute pain of left knee 11/24/2015  . Smoking 11/06/2013  . Elevated BP 11/06/2013  . Migraine without aura 03/26/2012  . Insomnia 03/26/2012  . Obesity (BMI 30-39.9) 03/26/2012  . 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 WISDOM TOOTH EXTRACTION       OB History    Gravida  1   Para  1   Term      Preterm  1   AB      Living  1     SAB      TAB      Ectopic      Multiple      Live Births  1            Home Medications    Prior to Admission medications   Medication Sig Start Date End Date Taking? Authorizing Provider  benzonatate (TESSALON) 100 MG capsule Take 1-2 capsules (100-200 mg total) by mouth 3 (three) times daily as needed for cough. 12/18/16   Wallis Bamberg, PA-C  benzonatate (TESSALON) 100 MG capsule Take 1 capsule (100 mg total) by mouth every 8 (eight) hours. 11/01/17   Raeford Razor, MD  dexamethasone (DECADRON) 4 MG tablet Take 1 tablet (4 mg total) by mouth 2 (two) times daily. 11/01/17   Raeford Razor, MD  ibuprofen (ADVIL,MOTRIN) 800 MG tablet Take 1 tablet (800 mg total) by mouth every 6 (six) hours as needed. 01/04/16   Espina, Lucita Lora, PA  norgestimate-ethinyl estradiol  (ORTHO-CYCLEN,SPRINTEC,PREVIFEM) 0.25-35 MG-MCG tablet Take 1 tablet by mouth 2 (two) times daily. Take 2 times daily for 2 weeks then one daily 08/31/15   Jean Rosenthal, NP  pseudoephedrine (SUDAFED 12 HOUR) 120 MG 12 hr tablet Take 1 tablet (120 mg total) by mouth 2 (two) times daily. 12/18/16   Wallis Bamberg, PA-C    Family History Family History  Problem Relation Age of Onset  . Diabetes Mother   . Hypertension Mother   . Dementia Maternal Grandmother   . Parkinson's disease Maternal Grandmother   . Hypertension Maternal Grandfather   . Cancer Paternal Grandfather   . Hypertension Paternal Aunt     Social History Social History   Tobacco Use  . Smoking status: Current Every Day Smoker    Packs/day: 0.50  . Smokeless tobacco: Never Used  Substance Use Topics  . Alcohol use: Yes    Comment: socially   . Drug use: No     Allergies   Acetaminophen; Codeine; Hydrocodone; and Other   Review of Systems Review of Systems  All systems reviewed and negative, other than as noted in HPI.  Physical Exam Updated Vital Signs  BP (!) 149/85 (BP Location: Right Arm)   Pulse 100   Temp 98.4 F (36.9 C) (Oral)   Resp 16   Ht 5\' 7"  (1.702 m)   Wt 98 kg   SpO2 100%   BMI 33.83 kg/m   Physical Exam  Constitutional: She appears well-developed and well-nourished. No distress.  HENT:  Head: Normocephalic and atraumatic.  Right Ear: External ear normal.  Left Ear: External ear normal.  Nose: Nose normal.  Mouth/Throat: Oropharynx is clear and moist. No oropharyngeal exudate.  Hoarse voice  Eyes: Pupils are equal, round, and reactive to light. Conjunctivae and EOM are normal. Right eye exhibits no discharge. Left eye exhibits no discharge.  Neck: Neck supple.  Cardiovascular: Normal rate, regular rhythm and normal heart sounds. Exam reveals no gallop and no friction rub.  No murmur heard. Pulmonary/Chest: Effort normal and breath sounds normal. No respiratory distress.    Abdominal: Soft. She exhibits no distension. There is no tenderness.  Musculoskeletal: She exhibits no edema or tenderness.  Neurological: She is alert.  Skin: Skin is warm and dry.  Psychiatric: She has a normal mood and affect. Her behavior is normal. Thought content normal.  Nursing note and vitals reviewed.    ED Treatments / Results  Labs (all labs ordered are listed, but only abnormal results are displayed) Labs Reviewed - No data to display  EKG None  Radiology Dg Chest 2 View  Result Date: 11/01/2017 CLINICAL DATA:  Sore throat, hoarseness, and congestion began x 4 days ago, yesterday she began coughing, n/v and diarrhea, fever, body aches, and shortness of breath. Phlegm is yellow/green EXAM: CHEST - 2 VIEW COMPARISON:  09/29/2014 FINDINGS: The heart size and mediastinal contours are within normal limits. Both lungs are clear. No pleural effusion or pneumothorax. The visualized skeletal structures are unremarkable. IMPRESSION: Normal chest radiographs. Electronically Signed   By: Amie Portland M.D.   On: 11/01/2017 09:06    Procedures Procedures (including critical care time)  Medications Ordered in ED Medications - No data to display   Initial Impression / Assessment and Plan / ED Course  I have reviewed the triage vital signs and the nursing notes.  Pertinent labs & imaging results that were available during my care of the patient were reviewed by me and considered in my medical decision making (see chart for details).     31yF with symptoms of laryngitis. Symptomatic tx. No signs of significant airway compromise.   Final Clinical Impressions(s) / ED Diagnoses   Final diagnoses:  Laryngitis    ED Discharge Orders         Ordered    dexamethasone (DECADRON) 4 MG tablet  2 times daily     11/01/17 1022    benzonatate (TESSALON) 100 MG capsule  Every 8 hours,   Status:  Discontinued     11/01/17 1022    benzonatate (TESSALON) 100 MG capsule  Every 8 hours      11/01/17 1033           Raeford Razor, MD 11/02/17 1530

## 2018-07-05 ENCOUNTER — Ambulatory Visit (HOSPITAL_COMMUNITY)
Admission: EM | Admit: 2018-07-05 | Discharge: 2018-07-05 | Disposition: A | Discharge status: Home / Self Care | Payer: BC Managed Care – PPO | Attending: Family Medicine | Admitting: Family Medicine

## 2018-07-05 ENCOUNTER — Encounter (HOSPITAL_COMMUNITY): Payer: Self-pay

## 2018-07-05 ENCOUNTER — Other Ambulatory Visit: Payer: Self-pay

## 2018-07-05 DIAGNOSIS — S61316A Laceration without foreign body of right little finger with damage to nail, initial encounter: Secondary | ICD-10-CM | POA: Diagnosis not present

## 2018-07-05 DIAGNOSIS — S61412A Laceration without foreign body of left hand, initial encounter: Secondary | ICD-10-CM | POA: Diagnosis not present

## 2018-07-05 DIAGNOSIS — Y9269 Other specified industrial and construction area as the place of occurrence of the external cause: Secondary | ICD-10-CM

## 2018-07-05 DIAGNOSIS — Z23 Encounter for immunization: Secondary | ICD-10-CM

## 2018-07-05 DIAGNOSIS — S61211A Laceration without foreign body of left index finger without damage to nail, initial encounter: Secondary | ICD-10-CM

## 2018-07-05 DIAGNOSIS — S61411A Laceration without foreign body of right hand, initial encounter: Secondary | ICD-10-CM | POA: Diagnosis not present

## 2018-07-05 DIAGNOSIS — W108XXA Fall (on) (from) other stairs and steps, initial encounter: Secondary | ICD-10-CM

## 2018-07-05 MED ORDER — TETANUS-DIPHTH-ACELL PERTUSSIS 5-2.5-18.5 LF-MCG/0.5 IM SUSP
INTRAMUSCULAR | Status: AC
  Filled 2018-07-05: qty 0.5

## 2018-07-05 MED ORDER — TETANUS-DIPHTHERIA TOXOIDS TD 5-2 LFU IM INJ
0.5000 mL | INJECTION | Freq: Once | INTRAMUSCULAR | Status: AC
  Administered 2018-07-05: 0.5 mL via INTRAMUSCULAR

## 2018-07-05 NOTE — ED Triage Notes (Signed)
Pt presents with cuts and laceration on both hands after a fall at work today.

## 2018-07-05 NOTE — ED Provider Notes (Signed)
[facility]    CSN: [id number] Arrival date & time: 07/05/18  1424     History   Chief Complaint Chief Complaint  Patient presents with  . Hand Lacs    HPI Julie [name] is a 33 y.o. female.   Presents today with multiple small lacerations and abrasions on bilateral palms and fingers after falling at work.  She states that she missed a step, tripped and fell.  She denies head injury or loss of consciousness.  She does not how long ago her last tetanus was.   The history is provided by the patient.    Past Medical History:  Diagnosis Date  . Abnormal vaginal bleeding   . Heart murmur   . Migraine   . Migraines     Patient Active Problem List   Diagnosis Date Noted  . Acute midline low back pain without sciatica 11/24/2015  . Acute pain of left knee 11/24/2015  . Smoking 11/06/2013  . Elevated BP 11/06/2013  . Migraine without aura 03/26/2012  . Insomnia 03/26/2012  . Obesity (BMI 30-39.9) 03/26/2012  . 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 WISDOM TOOTH EXTRACTION      OB History    Gravida  1   Para  1   Term      Preterm  1   AB      Living  1     SAB      TAB      Ectopic      Multiple      Live Births  1            Home Medications    Prior to Admission medications   Medication Sig Start Date End Date Taking? Authorizing Provider  Galcanezumab-gnlm [facility](EMGALITY) 120 MG/ML SOAJ Inject into the skin every 30 (thirty) days.   Yes [provider]  benzonatate (TESSALON) 100 MG capsule Take 1-2 capsules (100-200 mg total) by mouth 3 (three) times daily as needed for cough. 12/18/16   Wallis BambergMani, Mario, PA-C  benzonatate (TESSALON) 100 MG capsule Take 1 capsule (100 mg total) by mouth every 8 (eight) hours. 11/01/17   Raeford RazorKohut, Stephen, MD  dexamethasone (DECADRON) 4 MG tablet Take 1 tablet (4 mg total) by mouth 2 (two) times daily. 11/01/17   Raeford RazorKohut, Stephen, MD  ibuprofen  (ADVIL,MOTRIN) 800 MG tablet Take 1 tablet (800 mg total) by mouth every 6 (six) hours as needed. 01/04/16   Espina, Lucita LoraFrancisco Manuel, PA  norgestimate-ethinyl estradiol (ORTHO-CYCLEN,SPRINTEC,PREVIFEM) 0.25-35 MG-MCG tablet Take 1 tablet by mouth 2 (two) times daily. Take 2 times daily for 2 weeks then one daily 08/31/15   Jean RosenthalLineberry, Susan P, NP  pseudoephedrine (SUDAFED 12 HOUR) 120 MG 12 hr tablet Take 1 tablet (120 mg total) by mouth 2 (two) times daily. 12/18/16   Wallis BambergMani, Mario, PA-C    Family History Family History  Problem Relation Age of Onset  . Diabetes Mother   . Hypertension Mother   . Dementia Maternal Grandmother   . Parkinson's disease Maternal Grandmother   . Hypertension Maternal Grandfather   . Cancer Paternal Grandfather   . Hypertension Paternal Aunt     Social History Social History   Tobacco Use  . Smoking status: Current Every Day Smoker    Packs/day: 0.50  . Smokeless tobacco: Never Used  Substance Use Topics  . Alcohol use: Yes    Comment: socially   . Drug use:  No     Allergies   Acetaminophen, Codeine, Hydrocodone, Strawberry flavor, and Other   Review of Systems Review of Systems  Constitutional: Negative for chills and fever.  HENT: Negative for ear pain and sore throat.   Eyes: Negative for pain and visual disturbance.  Respiratory: Negative for cough and shortness of breath.   Cardiovascular: Negative for chest pain and palpitations.  Gastrointestinal: Negative for abdominal pain and vomiting.  Genitourinary: Negative for dysuria and hematuria.  Musculoskeletal: Negative for arthralgias and back pain.  Skin: Positive for wound. Negative for color change and rash.  Neurological: Negative for dizziness, seizures, syncope, weakness and light-headedness.  All other systems reviewed and are negative.    Physical Exam Triage Vital Signs ED Triage Vitals  Enc Vitals Group     BP 07/05/18 1457 135/84     Pulse Rate 07/05/18 1457 100     Resp  07/05/18 1457 18     Temp 07/05/18 1457 97.9 F (36.6 C)     Temp Source 07/05/18 1457 Oral     SpO2 07/05/18 1457 100 %     Weight --      Height --      Head Circumference --      Peak Flow --      Pain Score 07/05/18 1458 5     Pain Loc --      Pain Edu? --      Excl. in [place]? --    No data found.  Updated Vital Signs BP 135/84 (BP Location: Right Arm)   Pulse 100   Temp 97.9 F (36.6 C) (Oral)   Resp 18   LMP 06/21/2018   SpO2 100%   Visual Acuity Right Eye Distance:   Left Eye Distance:   Bilateral Distance:    Right Eye Near:   Left Eye Near:    Bilateral Near:     Physical Exam Vitals signs and nursing note reviewed.  Constitutional:      General: She is not in acute distress.    Appearance: She is well-developed.  HENT:     Head: Normocephalic and atraumatic.  Eyes:     Conjunctiva/sclera: Conjunctivae normal.  Neck:     Musculoskeletal: Neck supple.  Cardiovascular:     Rate and Rhythm: Normal rate and regular rhythm.     Heart sounds: No murmur.  Pulmonary:     Effort: Pulmonary effort is normal. No respiratory distress.     Breath sounds: Normal breath sounds.  Abdominal:     Palpations: Abdomen is soft.     Tenderness: There is no abdominal tenderness.  Musculoskeletal: Normal range of motion.        General: Signs of injury present. No tenderness or deformity.  Skin:    General: Skin is warm and dry.     Comments: Multiple small lacerations and abrasions on bilateral hands and fingers.  See suture procedure notes for wound specifics.    Neurological:     Mental Status: She is alert.      UC Treatments / Results  Labs (all labs ordered are listed, but only abnormal results are displayed) Labs Reviewed - No data to display  EKG   Radiology No results found.  Procedures Laceration Repair  Date/Time: 07/05/2018 4:09 PM Performed by: Sharion Balloon, NP Authorized by: Vanessa Kick, MD   Consent:    Consent obtained:  Verbal    Consent given by:  Patient Anesthesia (see MAR for exact dosages):  Anesthesia method:  Local infiltration   Local anesthetic:  Lidocaine 2% w/o epi Laceration details:    Location:  Hand   Hand location:  L palm   Length (cm):  2   Depth (mm):  2 Repair type:    Repair type:  Simple Pre-procedure details:    Preparation:  Patient was prepped and draped in usual sterile fashion Exploration:    Hemostasis achieved with:  Direct pressure   Wound exploration: wound explored through full range of motion and entire depth of wound probed and visualized   Treatment:    Area cleansed with:  Betadine   Amount of cleaning:  Standard   Irrigation solution:  Sterile saline Skin repair:    Repair method:  Sutures   Suture size:  4-0   Suture material:  Nylon   Number of sutures:  2 Approximation:    Approximation:  Close Post-procedure details:    Dressing:  Non-adherent dressing   Patient tolerance of procedure:  Tolerated well, no immediate complications Laceration Repair  Date/Time: 07/05/2018 4:13 PM Performed by: Mickie Bailate, Freeland Pracht H, NP Authorized by: Mardella LaymanHagler, Brian, MD   Consent:    Consent obtained:  Verbal   Consent given by:  Patient Anesthesia (see MAR for exact dosages):    Anesthesia method:  Local infiltration   Local anesthetic:  Lidocaine 2% w/o epi Laceration details:    Location:  Finger   Finger location:  L index finger   Length (cm):  1   Depth (mm):  2 Repair type:    Repair type:  Simple Pre-procedure details:    Preparation:  Patient was prepped and draped in usual sterile fashion Exploration:    Hemostasis achieved with:  Direct pressure   Wound exploration: wound explored through full range of motion and entire depth of wound probed and visualized   Treatment:    Area cleansed with:  Betadine   Amount of cleaning:  Standard   Irrigation solution:  Sterile saline Skin repair:    Repair method:  Sutures   Suture size:  4-0   Suture material:  Nylon    Suture technique:  Simple interrupted   Number of sutures:  1 Approximation:    Approximation:  Close Post-procedure details:    Dressing:  Non-adherent dressing   Patient tolerance of procedure:  Tolerated well, no immediate complications Laceration Repair  Date/Time: 07/05/2018 4:15 PM Performed by: Mickie Bailate, Creedon Danielski H, NP Authorized by: Mardella LaymanHagler, Brian, MD   Consent:    Consent obtained:  Verbal   Consent given by:  Patient Anesthesia (see MAR for exact dosages):    Anesthesia method:  Local infiltration   Local anesthetic:  Lidocaine 2% w/o epi Laceration details:    Location:  Hand   Hand location:  R palm   Length (cm):  1   Depth (mm):  2 Repair type:    Repair type:  Simple Pre-procedure details:    Preparation:  Patient was prepped and draped in usual sterile fashion Exploration:    Hemostasis achieved with:  Direct pressure Treatment:    Area cleansed with:  Betadine   Amount of cleaning:  Standard   Irrigation solution:  Sterile saline Skin repair:    Repair method:  Sutures   Suture size:  4-0   Suture material:  Nylon   Suture technique:  Simple interrupted   Number of sutures:  1 Approximation:    Approximation:  Close Post-procedure details:    Dressing:  Non-adherent dressing  Patient tolerance of procedure:  Tolerated well, no immediate complications Laceration Repair  Date/Time: 07/05/2018 4:16 PM Performed by: Mickie Bailate, Sandia Pfund H, NP Authorized by: Mardella LaymanHagler, Brian, MD   Consent:    Consent obtained:  Verbal   Consent given by:  Patient Anesthesia (see MAR for exact dosages):    Anesthesia method:  Local infiltration   Local anesthetic:  Lidocaine 2% w/o epi Laceration details:    Location:  Finger   Finger location:  R small finger   Length (cm):  1   Depth (mm):  2 Repair type:    Repair type:  Simple Pre-procedure details:    Preparation:  Patient was prepped and draped in usual sterile fashion Exploration:    Hemostasis achieved with:  Direct  pressure   Wound exploration: wound explored through full range of motion and entire depth of wound probed and visualized   Treatment:    Area cleansed with:  Betadine   Amount of cleaning:  Standard   Irrigation solution:  Sterile saline Skin repair:    Repair method:  Sutures   Suture size:  4-0   Suture material:  Nylon   Suture technique:  Simple interrupted   Number of sutures:  1 Approximation:    Approximation:  Close Post-procedure details:    Dressing:  Non-adherent dressing   Patient tolerance of procedure:  Tolerated well, no immediate complications   (including critical care time)  Medications Ordered in UC Medications  tetanus & diphtheria toxoids (adult) (TENIVAC) injection 0.5 mL (has no administration in time range)    Initial Impression / Assessment and Plan / UC Course  I have reviewed the triage vital signs and the nursing notes.  Pertinent labs & imaging results that were available during my care of the patient were reviewed by me and considered in my medical decision making (see chart for details).   Left palm laceration (2 sutures), right palm laceration (1 suture), left index finger laceration (1 suture), right small finger laceration (1 suture).  Wounds cleaned and sutured.  Discussed with patient wound care guidelines and need for suture removal in 7 to 10 days.  Discussed signs of infection and patient instructed to return here if she notices drainage, redness, worsening pain, or other concerning symptoms.  Tetanus shot given today.  Final Clinical Impressions(s) / UC Diagnoses   Final diagnoses:  Laceration of left hand without foreign body, initial encounter  Laceration of right hand without foreign body, initial encounter     Discharge Instructions     You received a tetanus shot today.  You received 2 stitches in your left palm, 1 stitch in your left index finger, 1 stitch in your right palm, and 1 stitch in your right small finger.  Keep the  wounds clean and dry.  Wrapped them with a dry bandage. Your stitches will need to come out in 7 to 10 days.  Return here if have drainage, redness, increased pain, or other signs of infection.     ED Prescriptions    None     Controlled Substance Prescriptions [place] Controlled Substance Registry consulted? Not Applicable   Mickie Bailate, Zamyiah Tino H, NP 07/05/18 1623

## 2018-07-05 NOTE — Discharge Instructions (Signed)
You received a tetanus shot today.  You received 2 stitches in your left palm, 1 stitch in your left index finger, 1 stitch in your right palm, and 1 stitch in your right small finger.  Keep the wounds clean and dry.  Wrapped them with a dry bandage. Your stitches will need to come out in 7 to 10 days.  Return here if have drainage, redness, increased pain, or other signs of infection.

## 2018-12-06 ENCOUNTER — Other Ambulatory Visit: Payer: Self-pay

## 2018-12-06 DIAGNOSIS — Z20822 Contact with and (suspected) exposure to covid-19: Secondary | ICD-10-CM

## 2018-12-08 LAB — NOVEL CORONAVIRUS, NAA: SARS-CoV-2, NAA: NOT DETECTED

## 2019-01-13 ENCOUNTER — Ambulatory Visit (HOSPITAL_COMMUNITY)
Admission: EM | Admit: 2019-01-13 | Discharge: 2019-01-13 | Disposition: A | Discharge status: Home / Self Care | Payer: 59 | Attending: Physician Assistant | Admitting: Physician Assistant

## 2019-01-13 ENCOUNTER — Encounter (HOSPITAL_COMMUNITY): Payer: Self-pay

## 2019-01-13 DIAGNOSIS — J069 Acute upper respiratory infection, unspecified: Secondary | ICD-10-CM

## 2019-01-13 DIAGNOSIS — U071 COVID-19: Secondary | ICD-10-CM | POA: Insufficient documentation

## 2019-01-13 DIAGNOSIS — Z20822 Contact with and (suspected) exposure to covid-19: Secondary | ICD-10-CM

## 2019-01-13 DIAGNOSIS — R0602 Shortness of breath: Secondary | ICD-10-CM | POA: Diagnosis present

## 2019-01-13 NOTE — Discharge Instructions (Addendum)
Take tylenol 325mg tablet x 2 up to every 6 hours for sore throat, fever and body aches. Do not exceed 8 tablets in 24 hours  Go directly to the Emergency Department or call 911 if you have severe chest pain, shortness of breath, severe diarrhea or feel as though you might pass out.    If your Covid-19 test is positive, you will receive a phone call from [place] regarding your results. Negative test results are not called. Both positive and negative results area always visible on MyChart. If you do not have a MyChart account, sign up instructions are in your discharge papers.   Persons who are directed to care for themselves at home may discontinue isolation under the following conditions:   At least 10 days have passed since symptom onset and  At least 24 hours have passed without running a fever (this means without the use of fever-reducing medications) and  Other symptoms have improved.  Persons infected with COVID-19 who never develop symptoms may discontinue isolation and other precautions 10 days after the date of their first positive COVID-19 test.   

## 2019-01-13 NOTE — ED Triage Notes (Signed)
Pt presents with complaints of shortness of breath, fatigue, sore throat and body aches that started on Thursday. Pt reports that her mother is positive for COVID. Pt denies trying any OTC medications. Reports trying honey and warm tea with no relief.

## 2019-01-13 NOTE — ED Provider Notes (Signed)
[place]    CSN: [id number] Arrival date & time: 01/13/19  1148      History   Chief Complaint Chief Complaint  Patient presents with  . Shortness of Breath  . Sore Throat  . Fatigue    HPI Julie [name] is a 34 y.o. female.   Patient reports to urgent care today for Covid testing. She reports her mother who lives with her tested positive for covid last week. She has since had 4 days of fatigue, bodyache and headache. She also reports a mild dry cough and one episode where she felt slightly short of breath. As well she endorses sore throat. All symptoms started Thursday of last week. She has not taken any medications for this. She denies chest pain, nausea, vomiting, diarrhea.     Past Medical History:  Diagnosis Date  . Abnormal vaginal bleeding   . Heart murmur   . Migraine   . Migraines     Patient Active Problem List   Diagnosis Date Noted  . Acute midline low back pain without sciatica 11/24/2015  . Acute pain of left knee 11/24/2015  . Smoking 11/06/2013  . Elevated BP 11/06/2013  . Migraine without aura 03/26/2012  . Insomnia 03/26/2012  . Obesity (BMI 30-39.9) 03/26/2012  . 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 WISDOM TOOTH EXTRACTION      OB History    Gravida  1   Para  1   Term      Preterm  1   AB      Living  1     SAB      TAB      Ectopic      Multiple      Live Births  1            Home Medications    Prior to Admission medications   Medication Sig Start Date End Date Taking? Authorizing Provider  Galcanezumab-gnlm [facility]) 120 MG/ML SOAJ Inject into the skin every 30 (thirty) days.   Yes [provider]  Ubrogepant (UBRELVY) 100 MG TABS Take by mouth.   Yes [provider]  benzonatate (TESSALON) 100 MG capsule Take 1-2 capsules (100-200 mg total) by mouth 3 (three) times daily as needed for cough. 12/18/16   Jaynee Eagles,  PA-C  benzonatate (TESSALON) 100 MG capsule Take 1 capsule (100 mg total) by mouth every 8 (eight) hours. 11/01/17   Virgel Manifold, MD  dexamethasone (DECADRON) 4 MG tablet Take 1 tablet (4 mg total) by mouth 2 (two) times daily. 11/01/17   Virgel Manifold, MD  ibuprofen (ADVIL,MOTRIN) 800 MG tablet Take 1 tablet (800 mg total) by mouth every 6 (six) hours as needed. 01/04/16   Espina, Kemper Durie, PA  norgestimate-ethinyl estradiol (ORTHO-CYCLEN,SPRINTEC,PREVIFEM) 0.25-35 MG-MCG tablet Take 1 tablet by mouth 2 (two) times daily. Take 2 times daily for 2 weeks then one daily 08/31/15   West Pugh, NP  pseudoephedrine (SUDAFED 12 HOUR) 120 MG 12 hr tablet Take 1 tablet (120 mg total) by mouth 2 (two) times daily. 12/18/16   Jaynee Eagles, PA-C    Family History Family History  Problem Relation Age of Onset  . Diabetes Mother   . Hypertension Mother   . Dementia Maternal Grandmother   . Parkinson's disease Maternal Grandmother   . Hypertension Maternal Grandfather   . Cancer Paternal Grandfather   . Hypertension Paternal Aunt  Social History Social History   Tobacco Use  . Smoking status: Current Every Day Smoker    Packs/day: 0.50  . Smokeless tobacco: Never Used  Substance Use Topics  . Alcohol use: Yes    Comment: socially   . Drug use: No     Allergies   Acetaminophen, Codeine, Hydrocodone, Strawberry flavor, and Other   Review of Systems Review of Systems  Constitutional: Positive for chills. Negative for fever.  HENT: Positive for congestion, postnasal drip, rhinorrhea and sore throat. Negative for ear pain, sinus pressure and sinus pain.   Eyes: Negative for pain and visual disturbance.  Respiratory: Positive for cough, chest tightness and shortness of breath. Negative for wheezing.   Cardiovascular: Negative for chest pain and palpitations.  Gastrointestinal: Negative for abdominal pain, diarrhea, nausea and vomiting.  Genitourinary: Negative for dysuria  and hematuria.  Musculoskeletal: Positive for myalgias. Negative for arthralgias and back pain.  Skin: Negative for color change and rash.  Neurological: Positive for headaches. Negative for syncope.  All other systems reviewed and are negative.    Physical Exam Triage Vital Signs ED Triage Vitals [01/13/19 1326]  Enc Vitals Group     BP      Pulse      Resp      Temp      Temp src      SpO2      Weight      Height      Head Circumference      Peak Flow      Pain Score 4     Pain Loc      Pain Edu?      Excl. in [place]?    No data found.  Updated Vital Signs BP 99/64   Pulse 86   Temp 98.5 F (36.9 C)   Resp 18   LMP 12/24/2018   SpO2 100%   Visual Acuity Right Eye Distance:   Left Eye Distance:   Bilateral Distance:    Right Eye Near:   Left Eye Near:    Bilateral Near:     Physical Exam Vitals and nursing note reviewed.  Constitutional:      General: She is not in acute distress.    Appearance: She is well-developed and normal weight. She is not ill-appearing.  HENT:     Head: Normocephalic and atraumatic.     Mouth/Throat:     Mouth: Mucous membranes are moist.  Eyes:     Conjunctiva/sclera: Conjunctivae normal.     Pupils: Pupils are equal, round, and reactive to light.  Cardiovascular:     Rate and Rhythm: Normal rate and regular rhythm.     Heart sounds: No murmur. No friction rub. No gallop.   Pulmonary:     Effort: Pulmonary effort is normal. No respiratory distress.     Breath sounds: Normal breath sounds. No decreased breath sounds, wheezing or rales.  Musculoskeletal:     Cervical back: Neck supple.     Right lower leg: No edema.     Left lower leg: No edema.  Skin:    General: Skin is warm and dry.     Capillary Refill: Capillary refill takes less than 2 seconds.  Neurological:     General: No focal deficit present.     Mental Status: She is alert and oriented to person, place, and time.  Psychiatric:        Mood and Affect: Mood  normal.  Behavior: Behavior normal.      UC Treatments / Results  Labs (all labs ordered are listed, but only abnormal results are displayed) Labs Reviewed  NOVEL CORONAVIRUS, NAA (HOSP ORDER, SEND-OUT TO REF LAB; TAT 18-24 HRS)    EKG   Radiology No results found.  Procedures Procedures (including critical care time)  Medications Ordered in UC Medications - No data to display  Initial Impression / Assessment and Plan / UC Course  I have reviewed the triage vital signs and the nursing notes.  Pertinent labs & imaging results that were available during my care of the patient were reviewed by me and considered in my medical decision making (see chart for details).     #Covid exposure #URI - Viral syndrome. Doing well on presentation today. COVID PCR Sent. Precautions discussed about follow up or ED. Recommended tylenol for symptom management.   Final Clinical Impressions(s) / UC Diagnoses   Final diagnoses:  Close exposure to COVID-19 virus  Encounter for laboratory testing for COVID-19 virus  Acute upper respiratory infection     Discharge Instructions     Take tylenol 325mg  tablet x 2 up to every 6 hours for sore throat, fever and body aches. Do not exceed 8 tablets in 24 hours  Go directly to the Emergency Department or call 911 if you have severe chest pain, shortness of breath, severe diarrhea or feel as though you might pass out.    If your Covid-19 test is positive, you will receive a phone call from [facility] regarding your results. Negative test results are not called. Both positive and negative results area always visible on MyChart. If you do not have a MyChart account, sign up instructions are in your discharge papers.   Persons who are directed to care for themselves at home may discontinue isolation under the following conditions:  . At least 10 days have passed since symptom onset and . At least 24 hours have passed without running a  fever (this means without the use of fever-reducing medications) and . Other symptoms have improved.  Persons infected with COVID-19 who never develop symptoms may discontinue isolation and other precautions 10 days after the date of their first positive COVID-19 test.       ED Prescriptions    None     PDMP not reviewed this encounter.   [facility], PA-C 01/13/19 1759

## 2019-01-15 LAB — NOVEL CORONAVIRUS, NAA (HOSP ORDER, SEND-OUT TO REF LAB; TAT 18-24 HRS): SARS-CoV-2, NAA: DETECTED — AB

## 2019-01-16 ENCOUNTER — Telehealth (HOSPITAL_COMMUNITY): Payer: Self-pay | Admitting: Emergency Medicine

## 2019-01-16 NOTE — Telephone Encounter (Signed)
Your test for COVID-19 was positive, meaning that you were infected with the novel coronavirus and could give the germ to others.  Please continue isolation at home for at least 10 days since the start of your symptoms. If you do not have symptoms, please isolate at home for 10 days from the day you were tested. Once you complete your 10 day quarantine, you may return to normal activities as long as you've not had a fever for over 24 hours(without taking fever reducing medicine) and your symptoms are improving. Please continue good preventive care measures, including:  frequent hand-washing, avoid touching your face, cover coughs/sneezes, stay out of crowds and keep a 6 foot distance from others.  Go to the nearest hospital emergency room if fever/cough/breathlessness are severe or illness seems like a threat to life.  Patient contacted by phone and made aware of    results. Pt verbalized understanding and had all questions answered.  Notified her of daughters test results and recommendations.

## 2020-01-13 ENCOUNTER — Other Ambulatory Visit (HOSPITAL_BASED_OUTPATIENT_CLINIC_OR_DEPARTMENT_OTHER): Payer: Self-pay | Admitting: Internal Medicine

## 2020-01-13 ENCOUNTER — Ambulatory Visit: Payer: Self-pay | Attending: Internal Medicine

## 2020-01-13 DIAGNOSIS — Z23 Encounter for immunization: Secondary | ICD-10-CM

## 2020-01-13 MED FILL — PFIZER-BIONTECH COVID-19 VA: 30 | 21 days supply | Qty: 0 | Fill #0

## 2020-01-13 NOTE — Progress Notes (Signed)
   Covid-19 Vaccination Clinic  Name:  Julie [name]    MRN: [id number] DOB: 04/01/1985  01/13/2020  Julie [name] was observed post Covid-19 immunization for 15 minutes without incident. She was provided with Vaccine Information Sheet and instruction to access the V-Safe system.   Julie [name] was instructed to call 911 with any severe reactions post vaccine: Marland Kitchen Difficulty breathing  . Swelling of face and throat  . A fast heartbeat  . A bad rash all over body  . Dizziness and weakness   Immunizations Administered    Name Date Dose VIS Date Route   Pfizer COVID-19 Vaccine 01/13/2020 12:42 PM 0.3 mL 10/22/2019 Intramuscular   Manufacturer: [employer], [employer]   Lot: [id number]   NDC: [address]-3

## 2020-01-20 ENCOUNTER — Ambulatory Visit: Payer: 59

## 2020-09-20 IMAGING — DX DG CHEST 2V
2 series · 2 of 2 positions shown · non-contrast
Comparison: 09/29/2014

CLINICAL DATA: Sore throat, hoarseness, and congestion began x 4
days ago, yesterday she began coughing, n/v and diarrhea, fever,
body aches, and shortness of breath. Phlegm is yellow/green

EXAM:
CHEST - 2 VIEW

[chest pa]
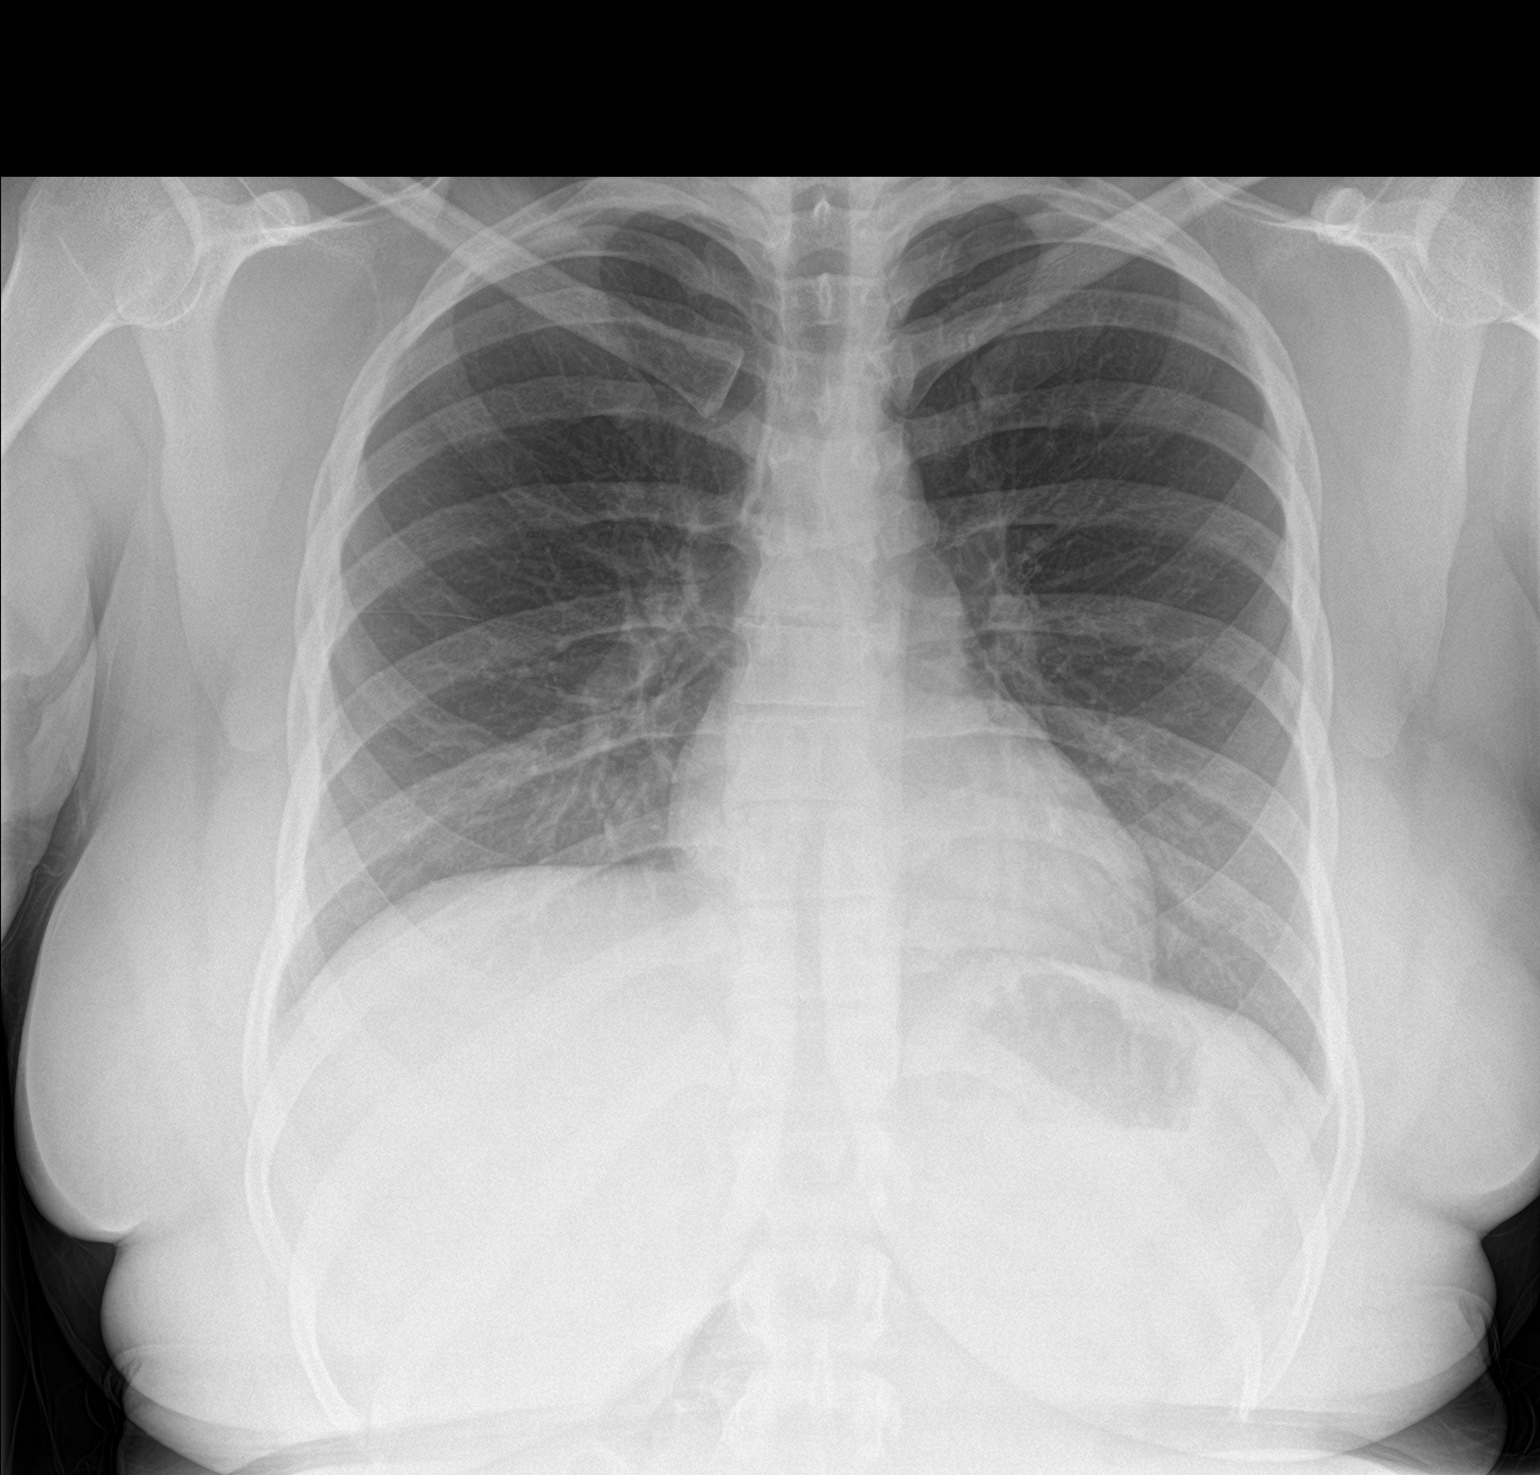

[chest lat]
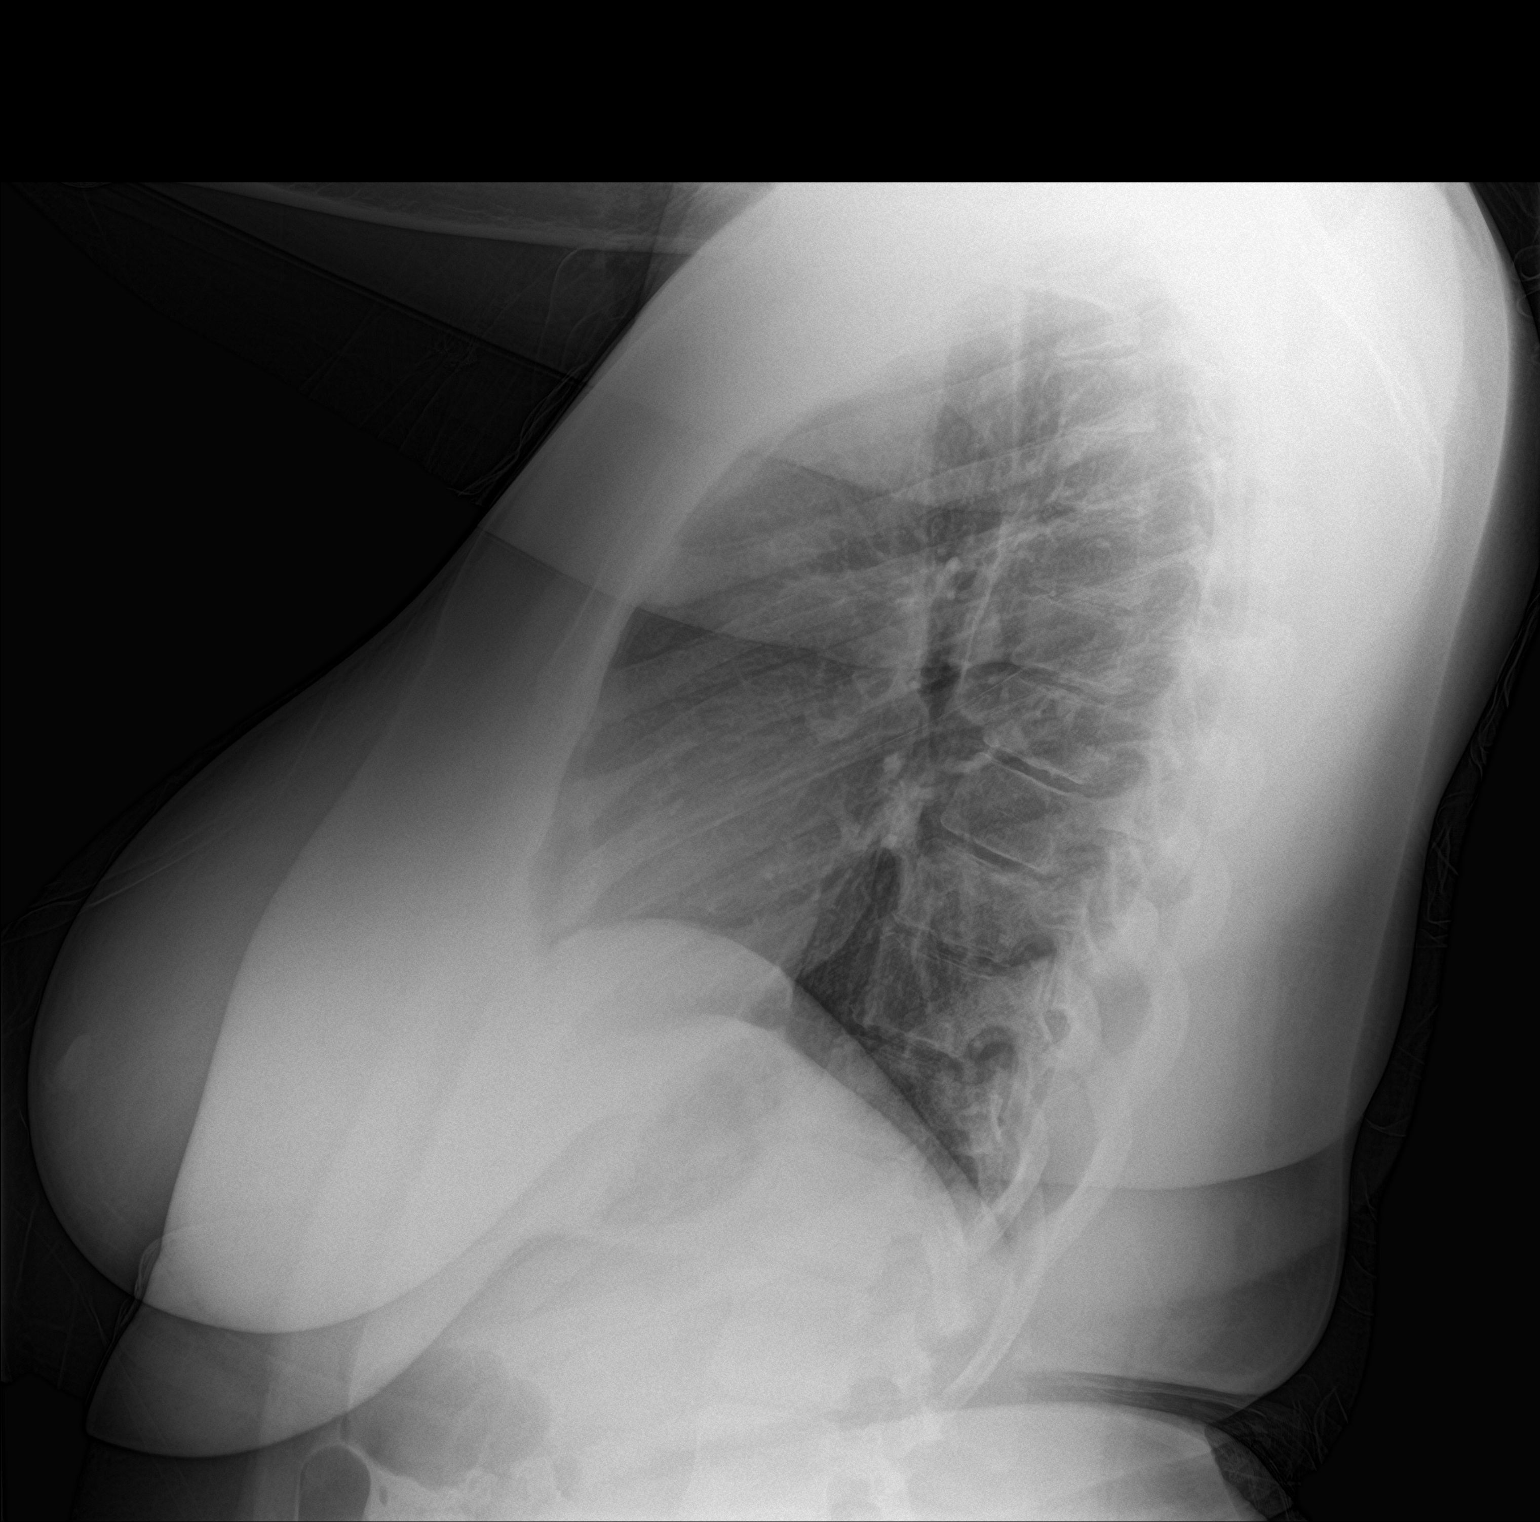

[2 of 2 positions shown; findings below may reference images not displayed]

FINDINGS: The heart size and mediastinal contours are within normal limits.
Both lungs are clear. No pleural effusion or pneumothorax. The
visualized skeletal structures are unremarkable.
IMPRESSION: Normal chest radiographs.

## 2021-01-30 ENCOUNTER — Other Ambulatory Visit: Payer: Self-pay

## 2021-01-30 ENCOUNTER — Emergency Department (HOSPITAL_BASED_OUTPATIENT_CLINIC_OR_DEPARTMENT_OTHER)
Admission: EM | Admit: 2021-01-30 | Discharge: 2021-01-30 | Disposition: A | Discharge status: Home / Self Care | Payer: Self-pay | Attending: Emergency Medicine | Admitting: Emergency Medicine

## 2021-01-30 DIAGNOSIS — G43009 Migraine without aura, not intractable, without status migrainosus: Secondary | ICD-10-CM

## 2021-01-30 DIAGNOSIS — H5789 Other specified disorders of eye and adnexa: Secondary | ICD-10-CM | POA: Insufficient documentation

## 2021-01-30 DIAGNOSIS — Z79899 Other long term (current) drug therapy: Secondary | ICD-10-CM | POA: Insufficient documentation

## 2021-01-30 DIAGNOSIS — U071 COVID-19: Secondary | ICD-10-CM

## 2021-01-30 LAB — RESP PANEL BY RT-PCR (FLU A&B, COVID) ARPGX2
Influenza A by PCR: NEGATIVE
Influenza B by PCR: NEGATIVE
SARS Coronavirus 2 by RT PCR: POSITIVE — AB

## 2021-01-30 MED ORDER — PROCHLORPERAZINE EDISYLATE 10 MG/2ML IJ SOLN
10.0000 mg | Freq: Once | INTRAMUSCULAR | Status: AC
  Administered 2021-01-30: 10 mg via INTRAVENOUS
  Filled 2021-01-30: qty 2

## 2021-01-30 MED ORDER — LACTATED RINGERS IV BOLUS
500.0000 mL | Freq: Once | INTRAVENOUS | Status: AC
  Administered 2021-01-30: 500 mL via INTRAVENOUS

## 2021-01-30 MED ORDER — DIPHENHYDRAMINE HCL 50 MG/ML IJ SOLN
25.0000 mg | Freq: Once | INTRAMUSCULAR | Status: AC
  Administered 2021-01-30: 25 mg via INTRAVENOUS
  Filled 2021-01-30: qty 1

## 2021-01-30 MED ORDER — KETOROLAC TROMETHAMINE 15 MG/ML IJ SOLN
15.0000 mg | Freq: Once | INTRAMUSCULAR | Status: AC
  Administered 2021-01-30: 15 mg via INTRAVENOUS
  Filled 2021-01-30: qty 1

## 2021-01-30 NOTE — ED Notes (Signed)
Dc instructions reviewed with pt no questions or concerns at this time. Will follow up as needed and will monitor o2 sats at home.

## 2021-01-30 NOTE — Discharge Instructions (Signed)
You have tested positive for COVID-19 virus.  Please continue to quarantine at home and monitor your symptoms closely. Antibiotics are not helpful in treating viral infection, the virus should run its course in about 5-7 days. Please make sure you are drinking plenty of fluids. You can treat your symptoms supportively with tylenol for fevers and pains, and over the counter cough syrups and throat lozenges to help with cough. If your symptoms are not improving please follow up with you Primary doctor.   I recommend that you purchase a home pulse ox to help better monitor your oxygen at home, if you start to have increased work of breathing or shortness of breath or your oxygen drops below 90% please immediately return to the hospital for reevaluation.  If you develop persistent fevers, shortness of breath or difficulty breathing, chest pain, severe headache and neck pain, persistent nausea and vomiting or other new or concerning symptoms return to the Emergency department.  

## 2021-01-30 NOTE — ED Provider Notes (Signed)
[facility] EMERGENCY DEPT Provider Note   CSN: [id number] Arrival date & time: 01/30/21  1558     History  Chief Complaint  Patient presents with   Generalized Body Aches   Eye Drainage    Julie [name] is a 36 y.o. female with history of migraines presents to the ED for evaluation of migraine and flulike symptoms.  Migraine started a few days ago, however her flulike symptoms started this morning.  She endorses fever of 101 F.  For her migraine, she is taken some Goody powder.  For her flulike symptoms, she has taken TheraFlu without any relief.  Her most pressing complaint is her migraine which is described as throbbing behind her left eye causing eye pain, serous drainage.  She also has new congestion and sore throat, but denies cough, abdominal pain, chest pain, shortness of breath.   HPI     Home Medications Prior to Admission medications   Medication Sig Start Date End Date Taking? Authorizing Provider  benzonatate (TESSALON) 100 MG capsule Take 1-2 capsules (100-200 mg total) by mouth 3 (three) times daily as needed for cough. 12/18/16   Wallis Bamberg, PA-C  benzonatate (TESSALON) 100 MG capsule Take 1 capsule (100 mg total) by mouth every 8 (eight) hours. 11/01/17   Raeford Razor, MD  dexamethasone (DECADRON) 4 MG tablet Take 1 tablet (4 mg total) by mouth 2 (two) times daily. 11/01/17   Raeford Razor, MD  Galcanezumab-gnlm [facility]) 120 MG/ML SOAJ Inject into the skin every 30 (thirty) days.    [provider]  ibuprofen (ADVIL,MOTRIN) 800 MG tablet Take 1 tablet (800 mg total) by mouth every 6 (six) hours as needed. 01/04/16   Espina, Lucita Lora, PA  norgestimate-ethinyl estradiol (ORTHO-CYCLEN,SPRINTEC,PREVIFEM) 0.25-35 MG-MCG tablet Take 1 tablet by mouth 2 (two) times daily. Take 2 times daily for 2 weeks then one daily 08/31/15   Jean Rosenthal, NP  pseudoephedrine (SUDAFED 12 HOUR) 120 MG 12 hr tablet Take 1 tablet (120 mg total) by  mouth 2 (two) times daily. 12/18/16   Wallis Bamberg, PA-C  Ubrogepant (UBRELVY) 100 MG TABS Take by mouth.    [provider]      Allergies    Acetaminophen, Codeine, Hydrocodone, Strawberry flavor, and Other    Review of Systems   Review of Systems  Constitutional:  Positive for fever.  HENT: Negative.    Eyes:  Positive for photophobia, pain and discharge.  Respiratory:  Negative for shortness of breath.   Cardiovascular: Negative.   Gastrointestinal:  Negative for abdominal pain and vomiting.  Endocrine: Negative.   Genitourinary: Negative.   Musculoskeletal: Negative.   Skin:  Negative for rash.  Neurological:  Positive for headaches.  All other systems reviewed and are negative.  Physical Exam Updated Vital Signs BP (!) 146/97 (BP Location: Right Arm)    Pulse 83    Temp 98.6 F (37 C) (Oral)    Resp 18    Ht 5\' 6"  (1.676 m)    Wt 89.8 kg    SpO2 100%    BMI 31.96 kg/m  Physical Exam Vitals and nursing note reviewed.  Constitutional:      General: She is not in acute distress.    Appearance: She is not ill-appearing.  HENT:     Head: Atraumatic.     Mouth/Throat:     Pharynx: Posterior oropharyngeal erythema present.  Eyes:     Conjunctiva/sclera: Conjunctivae normal.     Comments: EOM is intact.  There is very mild scleral injection without drainage.  No EOM entrapment.  Eyes PERRL  Cardiovascular:     Rate and Rhythm: Normal rate and regular rhythm.     Pulses: Normal pulses.     Heart sounds: No murmur heard. Pulmonary:     Effort: Pulmonary effort is normal. No respiratory distress.     Breath sounds: Normal breath sounds.     Comments: Lung clear to ausculation bilaterally. No tachypnea, no accessory muscle use, no acute distress, no increased work of breathing, no decrease in air movement  Abdominal:     General: Abdomen is flat. There is no distension.     Palpations: Abdomen is soft.     Tenderness: There is no abdominal tenderness.   Musculoskeletal:        General: Normal range of motion.     Cervical back: Normal range of motion.  Skin:    General: Skin is warm and dry.     Capillary Refill: Capillary refill takes less than 2 seconds.  Neurological:     General: No focal deficit present.     Mental Status: She is alert.  Psychiatric:        Mood and Affect: Mood normal.    ED Results / Procedures / Treatments   Labs (all labs ordered are listed, but only abnormal results are displayed) Labs Reviewed  RESP PANEL BY RT-PCR (FLU A&B, COVID) ARPGX2 - Abnormal; Notable for the following components:      Result Value   SARS Coronavirus 2 by RT PCR POSITIVE (*)    All other components within normal limits    EKG None  Radiology No results found.  Procedures Procedures    Medications Ordered in ED Medications  ketorolac (TORADOL) 15 MG/ML injection 15 mg (15 mg Intravenous Given 01/30/21 1806)  prochlorperazine (COMPAZINE) injection 10 mg (10 mg Intravenous Given 01/30/21 1808)  diphenhydrAMINE (BENADRYL) injection 25 mg (25 mg Intravenous Given 01/30/21 1803)  lactated ringers bolus 500 mL ( Intravenous Stopped 01/30/21 1907)    ED Course/ Medical Decision Making/ A&P                           Medical Decision Making Risk Prescription drug management.   History:  Julie [name] is a 36 y.o. female with history of migraines presents to the ED for evaluation of migraine and flulike symptoms.  Migraine started a few days ago, however her flulike symptoms started this morning.  She endorses fever of 101 F.  For her migraine, she is taken some Goody powder.  For her flulike symptoms, she has taken TheraFlu without any relief.  Her most pressing complaint is her migraine which is described as throbbing behind her left eye causing eye pain, serous drainage.  She also has new congestion and sore throat, but denies cough, abdominal pain, chest pain, shortness of breath.  This patient presents to the ED for  concern of migraine, this involves an extensive number of treatment options, and is a complaint that carries with it a high risk of complications and morbidity.   Emergent considerations for headache include subarachnoid hemorrhage, meningitis, temporal arteritis, glaucoma, cerebral ischemia, carotid/vertebral dissection, intracranial tumor, Venous sinus thrombosis, carbon monoxide poisoning, acute or chronic subdural hemorrhage.  Other considerations include: Migraine, Cluster headache, Hypertension, Caffeine, alcohol, or drug withdrawal, Pseudotumor cerebri, Arteriovenous malformation, Head injury, Neurocysticercosis, Post-lumbar puncture, Preeclampsia, Tension headache, Sinusitis, Cervical arthritis, Refractive error causing strain, Dental abscess,  Otitis media, Temporomandibular joint syndrome, Depression, Somatoform disorder (eg, somatization) Trigeminal neuralgia, Glossopharyngeal neuralgia.   Initial impression:  Patient is lying in the dark in the fetal position in obvious discomfort.  Her right eye does have mild injection with some clear, nonpurulent drainage.  Throat was erythematous without tonsillar swelling.  I will obtain respiratory panel rule out COVID and flu.  Headache is likely migraine versus cluster headache.  Given that this has not have been in a cluster pattern, I will treat for migraine and reassess.  Lab Tests and EKG:  I Ordered, reviewed, and interpreted labs and EKG.  The pertinent results in my decision-making regarding them are detailed in the ED course and/or initial impression section above.  Medicines ordered and prescription drug management:  I ordered medication including: Compazine 10 mg Toradol 15 mg IV Benadryl 25 mg IV for migraine  Reevaluation of the patient after these medicines showed that the patient resolved I have reviewed the patients home medicines and have made adjustments as needed  Disposition:  After consideration of the diagnostic results,  physical exam, history and the patients response to treatment feel that the patent would benefit from discharge with strict return precautions.   COVID 19 Migraine:  Migraine resolved with migraine cocktail.  Patient is COVID-positive.  Physical exam otherwise reassuring, O2 sats 100% on room air.  We discussed at home support measures, and patient was advised on return precautions.  All questions asked and answered.  Discharged home in good condition.    Final Clinical Impression(s) / ED Diagnoses Final diagnoses:  COVID-19  Migraine without aura and without status migrainosus, not intractable    Rx / DC Orders ED Discharge Orders     None         Delight OvensConklin, Girtha Kilgore R, PA-C 01/30/21 2230    Vanetta MuldersZackowski, Scott, MD 01/30/21 2329

## 2021-01-30 NOTE — ED Triage Notes (Signed)
Headache x 3 days, this morning woke with right eye swollen and draining, , complains of generalized body aches all over, chills and sore throat.

## 2021-06-16 ENCOUNTER — Encounter: Payer: Self-pay | Admitting: Family Medicine

## 2021-06-16 ENCOUNTER — Ambulatory Visit (INDEPENDENT_AMBULATORY_CARE_PROVIDER_SITE_OTHER): Payer: BC Managed Care – PPO | Admitting: Family Medicine

## 2021-06-16 VITALS — BP 112/68 | HR 92 | Ht 67.0 in | Wt 192.2 lb

## 2021-06-16 DIAGNOSIS — E669 Obesity, unspecified: Secondary | ICD-10-CM

## 2021-06-16 DIAGNOSIS — N946 Dysmenorrhea, unspecified: Secondary | ICD-10-CM

## 2021-06-16 DIAGNOSIS — Z6830 Body mass index (BMI) 30.0-30.9, adult: Secondary | ICD-10-CM | POA: Diagnosis not present

## 2021-06-16 DIAGNOSIS — G4700 Insomnia, unspecified: Secondary | ICD-10-CM

## 2021-06-16 DIAGNOSIS — G43009 Migraine without aura, not intractable, without status migrainosus: Secondary | ICD-10-CM | POA: Diagnosis not present

## 2021-06-16 LAB — COMPREHENSIVE METABOLIC PANEL
ALT: 13 U/L (ref 0–35)
AST: 15 U/L (ref 0–37)
Albumin: 4.4 g/dL (ref 3.5–5.2)
Alkaline Phosphatase: 79 U/L (ref 39–117)
BUN: 19 mg/dL (ref 6–23)
CO2: 26 mEq/L (ref 19–32)
Calcium: 9.5 mg/dL (ref 8.4–10.5)
Chloride: 109 mEq/L (ref 96–112)
Creatinine, Ser: 0.91 mg/dL (ref 0.40–1.20)
GFR: 82.9 mL/min (ref >60.00)
Glucose, Bld: 60 mg/dL — ABNORMAL LOW (ref 70–99)
Potassium: 4.1 mEq/L (ref 3.5–5.1)
Sodium: 142 mEq/L (ref 135–145)
Total Bilirubin: 0.3 mg/dL (ref 0.2–1.2)
Total Protein: 6.8 g/dL (ref 6.0–8.3)

## 2021-06-16 LAB — CBC
HCT: 37.1 % (ref 36.0–46.0)
Hemoglobin: 12 g/dL (ref 12.0–15.0)
MCHC: 32.4 g/dL (ref 30.0–36.0)
MCV: 82.6 fl (ref 78.0–100.0)
Platelets: 191 10*3/uL (ref 150.0–400.0)
RBC: 4.5 Mil/uL (ref 3.87–5.11)
RDW: 14.3 % (ref 11.5–15.5)
WBC: 6.9 10*3/uL (ref 4.0–10.5)

## 2021-06-16 LAB — LIPID PANEL
Cholesterol: 227 mg/dL — ABNORMAL HIGH (ref 0–200)
HDL: 38.8 mg/dL — ABNORMAL LOW (ref >39.00)
LDL Cholesterol: 164 mg/dL — ABNORMAL HIGH (ref 0–99)
NonHDL: 187.7
Total CHOL/HDL Ratio: 6
Triglycerides: 118 mg/dL (ref 0.0–149.0)
VLDL: 23.6 mg/dL (ref 0.0–40.0)

## 2021-06-16 LAB — TSH: TSH: 0.88 u[IU]/mL (ref 0.35–5.50)

## 2021-06-16 LAB — HEMOGLOBIN A1C: Hgb A1c MFr Bld: 5.8 % (ref 4.6–6.5)

## 2021-06-16 NOTE — Assessment & Plan Note (Signed)
Given severity of insomnia and frequent apneic events that wake her up, referring to sleep specialist to further evaluate and possibly do sleep study before we consider adding sleep agents. Patient agreeable.

## 2021-06-16 NOTE — Patient Instructions (Signed)
Thank you for choosing [facility] for your Primary Care needs. I am excited for the opportunity to partner with you to meet your health care goals. It was a pleasure meeting you today!   Information on diet, exercise, and health maintenance recommendations are listed below. This is information to help you be sure you are on track for optimal health and monitoring.   Please look over this and let us know if you have any questions or if you have completed any of the health maintenance outside of [place] so that we can be sure your records are up to date.  ___________________________________________________________  MyChart:  For all urgent or time sensitive needs we ask that you please call the office to avoid delays. Our number is [phone]. MyChart is not constantly monitored and due to the large volume of messages a day, replies may take up to 72 business hours.  MyChart Policy: MyChart allows for you to see your visit notes, after visit summary, provider recommendations, lab and tests results, make an appointment, request refills, and contact your provider or the office for non-urgent questions or concerns. Providers are seeing patients during normal business hours and do not have built in time to review MyChart messages.  We ask that you allow a minimum of 3 business days for responses to MyChart messages. For this reason, please do not send urgent requests through MyChart. Please call the office at [phone]. New and ongoing conditions may require a visit. We have virtual and in-person visits available for your convenience.  Complex MyChart concerns may require a visit. Your provider may request you schedule a virtual or in-person visit to ensure we are providing the best care possible. MyChart messages sent after 11:00 AM on Friday will not be received by the provider until Monday morning.    Lab and Test Results: You will receive your lab and  test results on MyChart as soon as they are completed and results have been sent by the lab or testing facility. Due to this service, you will receive your results BEFORE your provider.  I review lab and test results each morning prior to seeing patients. Some results require collaboration with other providers to ensure you are receiving the most appropriate care. For this reason, we ask that you please allow a minimum of 3-5 business days from the time that ALL results have been received for your provider to receive and review lab and test results and contact you about these.  Most lab and test result comments from the provider will be sent through MyChart. Your provider may recommend changes to the plan of care, follow-up visits, repeat testing, ask questions, or request an office visit to discuss these results. You may reply directly to this message or call the office to provide information for the provider or set up an appointment. In some instances, you will be called with test results and recommendations. Please let us know if this is preferred and we will make note of this in your chart to provide this for you.    If you have not heard a response to your lab or test results in 5 business days from all results returning to MyChart, please call the office to let us know. We ask that you please avoid calling prior to this time unless there is an emergent concern. Due to high call volumes, this can delay the resulting process.  After Hours: For all non-emergency after hours needs,   please call the office at [phone] and select the option to reach the on-call  service. On-call services are shared between multiple  offices and therefore it will not be possible to speak directly with your provider. On-call providers may provide medical advice and recommendations, but are unable to provide refills for maintenance medications.  For all emergency or urgent medical needs after normal business  hours, we recommend that you seek care at the closest Urgent Care or Emergency Department to ensure appropriate treatment in a timely manner.  [facility] has a 24 hour emergency room located on the ground floor for your convenience.   Urgent Concerns During the Business Day Providers are seeing patients from 8AM to 5PM with a busy schedule and are most often not able to respond to non-urgent calls until the end of the day or the next business day. If you should have URGENT concerns during the day, please call and speak to the nurse or schedule a same day appointment so that we can address your concern without delay.   Thank you, again, for choosing me as your health care partner. I appreciate your trust and look forward to learning more about you.   Sherlyn Ebbert B. Paraskevi Funez, DNP, FNP-C  ___________________________________________________________  Health Maintenance Recommendations Screening Testing Mammogram Every 1-2 years based on history and risk factors Starting at age 50 Pap Smear Ages 21-39 every 3 years Ages 30-65 every 5 years with HPV testing More frequent testing may be required based on results and history Colon Cancer Screening Every 1-10 years based on test performed, risk factors, and history Starting at age 45 Bone Density Screening Every 2-10 years based on history Starting at age 65 for women Recommendations for men differ based on medication usage, history, and risk factors AAA Screening One time ultrasound Men 65-75 years old who have ever smoked Lung Cancer Screening Low Dose Lung CT every 12 months Age 50-80 years with a 20 pack-year smoking history who still smoke or who have quit within the last 15 years  Screening Labs Routine  Labs: Complete Blood Count (CBC), Complete Metabolic Panel (CMP), Cholesterol (Lipid Panel) Every 6-12 months based on history and medications May be recommended more frequently based on current conditions or  previous results Hemoglobin A1c Lab Every 3-12 months based on history and previous results Starting at age 45 or earlier with diagnosis of diabetes, high cholesterol, BMI >26, and/or risk factors Frequent monitoring for patients with diabetes to ensure blood sugar control Thyroid Panel (TSH w/ T3 & T4) Every 6 months based on history, symptoms, and risk factors May be repeated more often if on medication HIV One time testing for all patients 13 and older May be repeated more frequently for patients with increased risk factors or exposure Hepatitis C One time testing for all patients 18 and older May be repeated more frequently for patients with increased risk factors or exposure Gonorrhea, Chlamydia Every 12 months for all sexually active persons 13-24 years Additional monitoring may be recommended for those who are considered high risk or who have symptoms PSA Men 40-54 years old with risk factors Additional screening may be recommended from age 55-69 based on risk factors, symptoms, and history  Vaccine Recommendations Tetanus Booster All adults every 10 years Flu Vaccine All patients 6 months and older every year COVID Vaccine All patients 12 years and older Initial dosing with booster May recommend additional booster based on age and health history HPV Vaccine 2 doses all patients   age 9-26 Dosing may be considered for patients over 26 Shingles Vaccine (Shingrix) 2 doses all adults 50 years and older Pneumonia (Pneumovax 23) All adults 65 years and older May recommend earlier dosing based on health history Pneumonia (Prevnar 13) All adults 65 years and older Dosed 1 year after Pneumovax 23 Pneumonia (Prevnar 20) All adults 65 years and older (adults 19-64 with certain conditions or risk factors) 1 dose  For those who have no received Prevnar 13 vaccine previously   Additional Screening, Testing, and Vaccinations may be recommended on an individualized basis based on  family history, health history, risk factors, and/or exposure.  __________________________________________________________  Diet Recommendations for All Patients  I recommend that all patients maintain a diet low in saturated fats, carbohydrates, and cholesterol. While this can be challenging at first, it is not impossible and small changes can make big differences.  Things to try: Decreasing the amount of soda, sweet tea, and/or juice to one or less per day and replace with water While water is always the first choice, if you do not like water you may consider adding a water additive without sugar to improve the taste other sugar free drinks Replace potatoes with a brightly colored vegetable at dinner Use healthy oils, such as canola oil or olive oil, instead of butter or hard margarine Limit your bread intake to two pieces or less a day Replace regular pasta with low carb pasta options Bake, broil, or grill foods instead of frying Monitor portion sizes  Eat smaller, more frequent meals throughout the day instead of large meals  An important thing to remember is, if you love foods that are not great for your health, you don't have to give them up completely. Instead, allow these foods to be a reward when you have done well. Allowing yourself to still have special treats every once in a while is a nice way to tell yourself thank you for working hard to keep yourself healthy.   Also remember that every day is a new day. If you have a bad day and "fall off the wagon", you can still climb right back up and keep moving along on your journey!  We have resources available to help you!  Some websites that may be helpful include: www.MyPlate.gov  Www.VeryWellFit.com _____________________________________________________________  Activity Recommendations for All Patients  I recommend that all adults get at least 20 minutes of moderate physical activity that elevates your heart rate at least 5  days out of the week.  Some examples include: Walking or jogging at a pace that allows you to carry on a conversation Cycling (stationary bike or outdoors) Water aerobics Yoga Weight lifting Dancing If physical limitations prevent you from putting stress on your joints, exercise in a pool or seated in a chair are excellent options.  Do determine your MAXIMUM heart rate for activity: 220 - YOUR AGE = MAX Heart Rate   Remember! Do not push yourself too hard.  Start slowly and build up your pace, speed, weight, time in exercise, etc.  Allow your body to rest between exercise and get good sleep. You will need more water than normal when you are exerting yourself. Do not wait until you are thirsty to drink. Drink with a purpose of getting in at least 8, 8 ounce glasses of water a day plus more depending on how much you exercise and sweat.    If you begin to develop dizziness, chest pain, abdominal pain, jaw pain, shortness of breath, headache,   vision changes, lightheadedness, or other concerning symptoms, stop the activity and allow your body to rest. If your symptoms are severe, seek emergency evaluation immediately. If your symptoms are concerning, but not severe, please let us know so that we can recommend further evaluation.     

## 2021-06-16 NOTE — Assessment & Plan Note (Signed)
Patient would like to see GYN to discuss any new potential birth control options as she has failed others d/t migraines.

## 2021-06-16 NOTE — Progress Notes (Signed)
New Patient Office Visit  Subjective    Patient ID: Julie [name], female    DOB: 1985-12-08  Age: 36 y.o. MRN: [id number]  CC: establish care    HPI Julie [name] presents to establish care. Recent insurance change. 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    History of migraines: - not currently on medication and no episodes other than when she had COVID recently - does not want to do any medications, has been avoiding trigger foods and lifestyle management - seems to be working well    GYN referral: - She would like birth control, but has had trouble with various options in the past. She would like to discuss with GYN. Has had trouble with her migraines in the past while on birth control. Interested in birth control options - currently using calendar method and condoms for now. Periods have never really been regular. Occurring once a month, but very heavy. Last pap was 2021 and normal (need to request records).   Insomnia: - can go 2 days with no sleep; on day 3 after work will fall asleep by midnight and be up again at 3-4 am. Never gets a consistent 4-6 hours of sleep. - this has been going on for the past 2 years; cannot think of any triggers - she has tried adjusting bedtime routine, scheduled bedtimes, prayer/meditations, reading, no screen times, no late meals/exercise/caffeine, herbal teas, etc - even she does take a melatonin or benadryl to help her fall asleep, she will be up by 4 am and not able to go back to sleep - trying to stay active during the day, getting exercise, family time, etc.  - she snores and was has had some observed apnea, not excessively tired during the daytime       Outpatient Encounter Medications as of 06/16/2021  Medication Sig   [DISCONTINUED] benzonatate (TESSALON) 100 MG capsule Take 1-2 capsules (100-200 mg total) by mouth 3 (three) times daily as needed for cough.   [DISCONTINUED] benzonatate (TESSALON)  100 MG capsule Take 1 capsule (100 mg total) by mouth every 8 (eight) hours.   [DISCONTINUED] dexamethasone (DECADRON) 4 MG tablet Take 1 tablet (4 mg total) by mouth 2 (two) times daily.   [DISCONTINUED] Galcanezumab-gnlm (EMGALITY) 120 MG/ML SOAJ Inject into the skin every 30 (thirty) days.   [DISCONTINUED] ibuprofen (ADVIL,MOTRIN) 800 MG tablet Take 1 tablet (800 mg total) by mouth every 6 (six) hours as needed.   [DISCONTINUED] norgestimate-ethinyl estradiol (ORTHO-CYCLEN,SPRINTEC,PREVIFEM) 0.25-35 MG-MCG tablet Take 1 tablet by mouth 2 (two) times daily. Take 2 times daily for 2 weeks then one daily   [DISCONTINUED] pseudoephedrine (SUDAFED 12 HOUR) 120 MG 12 hr tablet Take 1 tablet (120 mg total) by mouth 2 (two) times daily.   [DISCONTINUED] Ubrogepant (UBRELVY) 100 MG TABS Take by mouth.   No facility-administered encounter medications on file as of 06/16/2021.    Past Medical History:  Diagnosis Date   Abnormal vaginal bleeding    Heart murmur    Migraine    Migraines     Past Surgical History:  Procedure Laterality Date   WISDOM TOOTH EXTRACTION      Family History  Problem Relation Age of Onset   Miscarriages / Stillbirths Mother    Hyperlipidemia Mother    Arthritis Mother    Diabetes Mother    Hypertension Mother    Diabetes Father    Alcohol abuse Father    Hypertension Paternal Engineer, mining  Dementia Maternal Grandmother    Parkinson's disease Maternal Grandmother    Hypertension Maternal Grandfather    COPD Paternal Grandmother    Arthritis Paternal Grandmother    Heart disease Paternal Grandfather    COPD Paternal Grandfather    Cancer Paternal Grandfather     Social History   Socioeconomic History   Marital status: Single    Spouse name: Not on file   Number of children: Not on file   Years of education: Not on file   Highest education level: Not on file  Occupational History   Not on file  Tobacco Use   Smoking status: Every Day    Packs/day: 0.50     Types: Cigarettes   Smokeless tobacco: Never  Substance and Sexual Activity   Alcohol use: Not Currently    Comment: socially    Drug use: Never   Sexual activity: Yes    Partners: Male    Birth control/protection: Condom  Other Topics Concern   Not on file  Social History Narrative   Not on file   Social Determinants of Health   Financial Resource Strain: Not on file  Food Insecurity: Not on file  Transportation Needs: Not on file  Physical Activity: Not on file  Stress: Not on file  Social Connections: Not on file  Intimate Partner Violence: Not on file    ROS All review of systems negative except what is listed in the HPI      Objective    BP 112/68   Pulse 92   Ht 5\' 7"  (1.702 m)   Wt 192 lb 3.2 oz (87.2 kg)   LMP 06/13/2021 (Exact Date)   BMI 30.10 kg/m   Physical Exam Vitals reviewed.  Constitutional:      Appearance: Normal appearance.  Cardiovascular:     Rate and Rhythm: Normal rate and regular rhythm.     Pulses: Normal pulses.  Pulmonary:     Effort: Pulmonary effort is normal.     Breath sounds: Normal breath sounds.  Musculoskeletal:     Cervical back: Normal range of motion and neck supple. No tenderness.     Right lower leg: No edema.     Left lower leg: No edema.  Lymphadenopathy:     Cervical: No cervical adenopathy.  Skin:    General: Skin is warm and dry.  Neurological:     General: No focal deficit present.     Mental Status: She is alert and oriented to person, place, and time. Mental status is at baseline.  Psychiatric:        Mood and Affect: Mood normal.        Behavior: Behavior normal.        Thought Content: Thought content normal.        Judgment: Judgment normal.         Assessment & Plan:   Problem List Items Addressed This Visit       Cardiovascular and Mediastinum   Migraine without aura    Doing well with lifestyle management only. No current medication and no desire to take preventative or abortive  medications.         Genitourinary   Dysmenorrhea    Patient would like to see GYN to discuss any new potential birth control options as she has failed others d/t migraines.       Relevant Orders   Ambulatory referral to Obstetrics / Gynecology     Other   Insomnia - Primary  Given severity of insomnia and frequent apneic events that wake her up, referring to sleep specialist to further evaluate and possibly do sleep study before we consider adding sleep agents. Patient agreeable.       Relevant Orders   Ambulatory referral to Sleep Studies   Class 1 obesity without serious comorbidity with body mass index (BMI) of 30.0 to 30.9 in adult    She has been doing really well with lifestyle changes, diet, exercise, but seems to have plateau around 192 pounds. Updating labs today to look for any underlying conditions.       Relevant Orders   Hemoglobin A1c   CBC   Comprehensive metabolic panel   Lipid panel   TSH    Return for CPE in 6-12 months.   Terrilyn Saver, NP

## 2021-06-16 NOTE — Progress Notes (Signed)
-   Metabolic panel is stable. Your glucose was low, but I believe you were fasting - make sure you are eating balanced meals to keep this regulated.  - Cholesterol is elevated. Would start by recommending lifestyle modifications listed below - A1c is still in the diabetic range, but improved from where it was in the past. Continue working on healthy diet and exercise.  - Blood counts and thyroid levels are stable   Lifestyle factors for lowering cholesterol include: Diet therapy - heart-healthy diet rich in fruits, veggies, fiber-rich whole grains, lean meats, chicken, fish (at least twice a week), fat-free or 1% dairy products; foods low in saturated/trans fats, cholesterol, sodium, and sugar. Mediterranean diet has shown to be very heart healthy. Regular exercise - recommend at least 30 minutes a day, 5 times per week Weight management

## 2021-06-16 NOTE — Assessment & Plan Note (Signed)
She has been doing really well with lifestyle changes, diet, exercise, but seems to have plateau around 192 pounds. Updating labs today to look for any underlying conditions.

## 2021-06-16 NOTE — Progress Notes (Signed)
Help with weight loss Birth control Std screen Labs Help with sleep

## 2021-06-16 NOTE — Assessment & Plan Note (Signed)
Doing well with lifestyle management only. No current medication and no desire to take preventative or abortive medications.

## 2021-06-17 ENCOUNTER — Encounter: Payer: Self-pay | Admitting: *Deleted

## 2021-06-28 ENCOUNTER — Telehealth: Payer: Self-pay

## 2021-08-02 ENCOUNTER — Ambulatory Visit (INDEPENDENT_AMBULATORY_CARE_PROVIDER_SITE_OTHER): Payer: BC Managed Care – PPO | Admitting: Family

## 2021-08-02 ENCOUNTER — Telehealth: Payer: Self-pay | Admitting: Family Medicine

## 2021-08-02 VITALS — BP 122/67 | HR 92 | Temp 97.9°F | Resp 16 | Wt 188.0 lb

## 2021-08-02 DIAGNOSIS — G43009 Migraine without aura, not intractable, without status migrainosus: Secondary | ICD-10-CM | POA: Diagnosis not present

## 2021-08-02 DIAGNOSIS — F419 Anxiety disorder, unspecified: Secondary | ICD-10-CM | POA: Diagnosis not present

## 2021-08-02 DIAGNOSIS — G4700 Insomnia, unspecified: Secondary | ICD-10-CM | POA: Diagnosis not present

## 2021-08-02 MED ORDER — SERTRALINE HCL 50 MG PO TABS: ORAL_TABLET | ORAL | 0 refills | Status: DC

## 2021-08-02 NOTE — Assessment & Plan Note (Signed)
Hopefully with a decrease in stress and treatment of her anxiety her sleep and migraines will improve.

## 2021-08-02 NOTE — Telephone Encounter (Signed)
Patient was supposed to have a follow up with Melissa in two week (two days before pt having to return to work) but due to [place] being on vacation and [employer] Ridgel on maternity leave then the only time she could come back for a f/u before she went back to work is on 08/10. Patient was unsure if this was too early for her to follow up. Please advise.

## 2021-08-02 NOTE — Progress Notes (Signed)
Subjective:   By signing my name below, I, Cassell Clement, attest that this documentation has been prepared under the direction and in the presence of Alma Downs' Su[place]ivan, NP 08/02/2021    Patient ID: Julie [name], female    DOB: [birth year], 36 y.o.   MRN: [id number]  Chief Complaint  Patient presents with   Migraine    Patient complains of increased migraine episodes    Stress    Patient complains of work related stress and increased anxiety   Insomnia    Patient reports trouble falling and staying sleep    HPI Patient is in today for an office visit.  Stress/Anxiety: She states that on the personal side of life, she is not experiencing any stress. However, on the work side of her life, she is experiencing stress and anxiety. 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 She feels as though she is micro managed at her occupation and also does not have a support system at her job. She states that she has communicated with her superior about the problem but the problem caused by her superior has not improved. She also does not have support from her HR department. Her job is making her question herself and her work ethic and ultimately doubting herself. Overall, she is not feeling like herself.  Migraines: She complains of migraines. She believes that symptoms could be due to her stress. For the last two weeks, she has had 6 to 8 days of migraines. She previously went to a neurologist and was prescribed injections for her symptoms. She states that previous oral medications did not improve her symptoms. She has since discontinued receiving injections and has been using herbal supplements and dieting to minimize pain symptoms. However, as of recently, her migraine symptoms are persistent.  Insomnia: She complains of insomnia.    Health Maintenance Due  Topic Date Due   HPV VACCINES (2 - 3-dose series) 04/04/2012   PAP SMEAR-Modifier  02/13/2016   COVID-19  Vaccine (2 - Pfizer series) 03/09/2020   INFLUENZA VACCINE  08/02/2021    Past Medical History:  Diagnosis Date   Abnormal vaginal bleeding    Heart murmur    Migraine    Migraines     Past Surgical History:  Procedure Laterality Date   WISDOM TOOTH EXTRACTION      Family History  Problem Relation Age of Onset   Miscarriages / Stillbirths Mother    Hyperlipidemia Mother    Arthritis Mother    Diabetes Mother    Hypertension Mother    Diabetes Father    Alcohol abuse Father    Hypertension Paternal Aunt    Dementia Maternal Grandmother    Parkinson's disease Maternal Grandmother    Hypertension Maternal Grandfather    COPD Paternal Grandmother    Arthritis Paternal Grandmother    Heart disease Paternal Grandfather    COPD Paternal Grandfather    Cancer Paternal Grandfather     Social History   Socioeconomic History   Marital status: Single    Spouse name: Not on file   Number of children: Not on file   Years of education: Not on file   Highest education level: Not on file  Occupational History   Not on file  Tobacco Use   Smoking status: Every Day    Packs/day: 0.50    Types: Cigarettes   Smokeless tobacco: Never  Substance and Sexual Activity   Alcohol use: Not Currently  Comment: socially    Drug use: Never   Sexual activity: Yes    Partners: Male    Birth control/protection: Condom  Other Topics Concern   Not on file  Social History Narrative   Not on file   Social Determinants of Health   Financial Resource Strain: Not on file  Food Insecurity: Not on file  Transportation Needs: Not on file  Physical Activity: Not on file  Stress: Not on file  Social Connections: Not on file  Intimate Partner Violence: Not on file    No outpatient medications prior to visit.   No facility-administered medications prior to visit.    Allergies  Allergen Reactions   Acetaminophen Shortness Of Breath, Swelling and Rash   Codeine Shortness Of Breath,  Swelling, Rash and Other (See Comments)    Hands swell   Hydrocodone    Strawberry Flavor    Other Hives and Rash    All seafood     Review of Systems  Neurological:  Positive for headaches.  Psychiatric/Behavioral:  The patient is nervous/anxious (+Stress) and has insomnia.        Objective:    Physical Exam Constitutional:      General: She is not in acute distress.    Appearance: Normal appearance. She is not ill-appearing.  HENT:     Head: Normocephalic and atraumatic.     Right Ear: External ear normal.     Left Ear: External ear normal.  Eyes:     Extraocular Movements: Extraocular movements intact.     Pupils: Pupils are equal, round, and reactive to light.  Cardiovascular:     Rate and Rhythm: Normal rate and regular rhythm.     Heart sounds: Normal heart sounds. No murmur heard.    No gallop.  Pulmonary:     Effort: Pulmonary effort is normal. No respiratory distress.     Breath sounds: Normal breath sounds. No wheezing or rales.  Skin:    General: Skin is warm and dry.  Neurological:     Mental Status: She is alert and oriented to person, place, and time.  Psychiatric:        Mood and Affect: Mood normal.        Behavior: Behavior normal.        Judgment: Judgment normal.     BP 122/67 (BP Location: Right Arm, Patient Position: Sitting, Cuff Size: Small)   Pulse 92   Temp 97.9 F (36.6 C) (Oral)   Resp 16   Wt 188 lb (85.3 kg)   SpO2 100%   BMI 29.44 kg/m  Wt Readings from Last 3 Encounters:  08/02/21 188 lb (85.3 kg)  06/16/21 192 lb 3.2 oz (87.2 kg)  01/30/21 198 lb (89.8 kg)       Assessment & Plan:   Problem List Items Addressed This Visit       Unprioritized   Migraine without aura - Primary    Uncontrolled. Has failed multiple migraine medications in the past. Will refer to neurology.       Relevant Medications   sertraline (ZOLOFT) 50 MG tablet   Other Relevant Orders   Ambulatory referral to Neurology   Insomnia     Hopefully with a decrease in stress and treatment of her anxiety her sleep and migraines will improve.       Anxiety    Uncontrolled. Exacerbated by her work environment and is affecting her overall health negatively. Will initiate zoloft 50mg .  I instructed pt to  start 1/2 tablet once daily for 1 week and then increase to a full tablet once daily on week two as tolerated.  We discussed common side effects such as nausea, drowsiness and weight gain. She is instructed to discontinue medication go directly to ED if this occurs.  Pt verbalizes understanding.  I have written her out of work for the next 2 weeks. I have also encouraged her to reach out to her EAP program to inquire about counseling. Plan follow up in 2 weeks to evaluate progress.          Relevant Medications   sertraline (ZOLOFT) 50 MG tablet    Meds ordered this encounter  Medications   sertraline (ZOLOFT) 50 MG tablet    Sig: 1/2 tab once daily for 1 week, then increase to a full tab once daily on week two    Dispense:  30 tablet    Refill:  0    Order Specific Question:   Supervising Provider    Answer:   Danise Edge A [[id number]]    I, Lemont Fillers, NP, personally preformed the services described in this documentation.  All medical record entries made by the scribe were at my direction and in my presence.  I have reviewed the chart and discharge instructions (if applicable) and agree that the record reflects my personal performance and is accurate and complete. 08/02/2021   I,Amber Collins,acting as a scribe for Lemont Fillers, NP.,have documented all relevant documentation on the behalf of Lemont Fillers, NP,as directed by  Lemont Fillers, NP while in the presence of Lemont Fillers, NP.    Lemont Fillers, NP

## 2021-08-02 NOTE — Patient Instructions (Signed)
Please begin zoloft 1/2 tab o

## 2021-08-02 NOTE — Assessment & Plan Note (Addendum)
Uncontrolled. Exacerbated by her work environment and is affecting her overall health negatively. Will initiate zoloft 50mg .  I instructed pt to start 1/2 tablet once daily for 1 week and then increase to a full tablet once daily on week two as tolerated.  We discussed common side effects such as nausea, drowsiness and weight gain. She is instructed to discontinue medication go directly to ED if this occurs.  Pt verbalizes understanding.  I have written her out of work for the next 2 weeks. I have also encouraged her to reach out to her EAP program to inquire about counseling. Plan follow up in 2 weeks to evaluate progress.

## 2021-08-02 NOTE — Assessment & Plan Note (Signed)
Uncontrolled. Has failed multiple migraine medications in the past. Will refer to neurology.

## 2021-08-03 NOTE — Telephone Encounter (Signed)
Pt scheduled  

## 2021-08-03 NOTE — Telephone Encounter (Signed)
LVM for pt to call back and schedule with Melissa on the 10th.

## 2021-08-04 ENCOUNTER — Telehealth: Payer: Self-pay

## 2021-08-04 NOTE — Telephone Encounter (Signed)
FMLA for received via fax and put in Melissa's folder to be completed.

## 2021-08-11 ENCOUNTER — Ambulatory Visit (INDEPENDENT_AMBULATORY_CARE_PROVIDER_SITE_OTHER): Payer: BC Managed Care – PPO | Admitting: Family

## 2021-08-11 ENCOUNTER — Telehealth: Payer: Self-pay

## 2021-08-11 DIAGNOSIS — F419 Anxiety disorder, unspecified: Secondary | ICD-10-CM

## 2021-08-11 DIAGNOSIS — G43009 Migraine without aura, not intractable, without status migrainosus: Secondary | ICD-10-CM | POA: Diagnosis not present

## 2021-08-11 DIAGNOSIS — Z0279 Encounter for issue of other medical certificate: Secondary | ICD-10-CM

## 2021-08-11 MED ORDER — SUMATRIPTAN SUCCINATE 50 MG PO TABS: 50.0000 mg | ORAL_TABLET | ORAL | 5 refills | Status: DC | PRN

## 2021-08-11 MED ORDER — ONDANSETRON HCL 4 MG PO TABS: 4.0000 mg | ORAL_TABLET | Freq: Three times a day (TID) | ORAL | 0 refills | Status: DC | PRN

## 2021-08-11 MED ORDER — KETOROLAC TROMETHAMINE 60 MG/2ML IM SOLN
60.0000 mg | Freq: Once | INTRAMUSCULAR | Status: AC
  Administered 2021-08-11: 60 mg via INTRAMUSCULAR

## 2021-08-11 MED ORDER — SERTRALINE HCL 50 MG PO TABS: 50.0000 mg | ORAL_TABLET | Freq: Every day | ORAL | 0 refills | Status: DC

## 2021-08-11 NOTE — Progress Notes (Signed)
Subjective:     Patient ID: Julie [name], female    DOB: Nov 07, 1985, 36 y.o.   MRN: [id number]  Chief Complaint  Patient presents with   Migraine    Complains of migraine headache since Saturday    HPI Patient is in today for follow up.  Anxiety- last visit we initiated zoloft.  She note anxiety about returning to work.  She started zoloft on Saturday. She notes that she has had some diarrhea since beginning sertraline.  Overall, since she has been home from work and started sertraline she reports some improvement in her anxiety.   Migraine- reports that she developed migraine this past Saturday.  She has associated nausea with some vomiting.    Health Maintenance Due  Topic Date Due   HPV VACCINES (2 - 3-dose series) 04/04/2012   PAP SMEAR-Modifier  02/13/2016   COVID-19 Vaccine (2 - Pfizer series) 03/09/2020   INFLUENZA VACCINE  08/02/2021    Past Medical History:  Diagnosis Date   Abnormal vaginal bleeding    Heart murmur    Migraine    Migraines     Past Surgical History:  Procedure Laterality Date   WISDOM TOOTH EXTRACTION      Family History  Problem Relation Age of Onset   Miscarriages / Stillbirths Mother    Hyperlipidemia Mother    Arthritis Mother    Diabetes Mother    Hypertension Mother    Diabetes Father    Alcohol abuse Father    Hypertension Paternal Aunt    Dementia Maternal Grandmother    Parkinson's disease Maternal Grandmother    Hypertension Maternal Grandfather    COPD Paternal Grandmother    Arthritis Paternal Grandmother    Heart disease Paternal Grandfather    COPD Paternal Grandfather    Cancer Paternal Grandfather     Social History   Socioeconomic History   Marital status: Single    Spouse name: Not on file   Number of children: Not on file   Years of education: Not on file   Highest education level: Not on file  Occupational History   Not on file  Tobacco Use   Smoking status: Every Day    Packs/day: 0.50     Types: Cigarettes   Smokeless tobacco: Never  Substance and Sexual Activity   Alcohol use: Not Currently    Comment: socially    Drug use: Never   Sexual activity: Yes    Partners: Male    Birth control/protection: Condom  Other Topics Concern   Not on file  Social History Narrative   Not on file   Social Determinants of Health   Financial Resource Strain: Not on file  Food Insecurity: Not on file  Transportation Needs: Not on file  Physical Activity: Not on file  Stress: Not on file  Social Connections: Not on file  Intimate Partner Violence: Not on file    Outpatient Medications Prior to Visit  Medication Sig Dispense Refill   sertraline (ZOLOFT) 50 MG tablet 1/2 tab once daily for 1 week, then increase to a full tab once daily on week two 30 tablet 0   No facility-administered medications prior to visit.    Allergies  Allergen Reactions   Acetaminophen Shortness Of Breath, Swelling and Rash   Codeine Shortness Of Breath, Swelling, Rash and Other (See Comments)    Hands swell   Hydrocodone    Strawberry Flavor    Other Hives and Rash    All seafood  ROS See HPI    Objective:    Physical Exam Constitutional:      General: She is not in acute distress.    Appearance: Normal appearance. She is well-developed.     Comments: Appears uncomfortable from migraine. Sitting in a dark room  HENT:     Head: Normocephalic and atraumatic.     Right Ear: External ear normal.     Left Ear: External ear normal.  Eyes:     General: No scleral icterus. Neck:     Thyroid: No thyromegaly.  Cardiovascular:     Rate and Rhythm: Normal rate and regular rhythm.     Heart sounds: Normal heart sounds. No murmur heard. Pulmonary:     Effort: Pulmonary effort is normal. No respiratory distress.     Breath sounds: Normal breath sounds. No wheezing.  Musculoskeletal:     Cervical back: Neck supple.  Skin:    General: Skin is warm and dry.  Neurological:     Mental  Status: She is alert and oriented to person, place, and time.  Psychiatric:        Mood and Affect: Mood normal.        Behavior: Behavior normal.        Thought Content: Thought content normal.        Judgment: Judgment normal.     BP 125/75 (BP Location: Right Arm, Patient Position: Sitting, Cuff Size: Small)   Pulse 80   Temp 98.4 F (36.9 C) (Oral)   Resp 16   Wt 191 lb (86.6 kg)   SpO2 100%   BMI 29.91 kg/m  Wt Readings from Last 3 Encounters:  08/11/21 191 lb (86.6 kg)  08/02/21 188 lb (85.3 kg)  06/16/21 192 lb 3.2 oz (87.2 kg)       Assessment & Plan:   Problem List Items Addressed This Visit       Unprioritized   Migraine without aura    Uncontrolled.  Toradol 60mg  IM here today.  Rx sent for prn imitrex and prn zofran. She is advised to keep scheduled appointment to re-establish with neurology in September.       Relevant Medications   SUMAtriptan (IMITREX) 50 MG tablet   sertraline (ZOLOFT) 50 MG tablet   Anxiety    She would like to continue zoloft with goal of returning to work on 8/21. She is having some diarrhea which we discussed should improve in the next few weeks.       Relevant Medications   sertraline (ZOLOFT) 50 MG tablet    I have changed Julie [name]'s sertraline. I am also having her start on SUMAtriptan and ondansetron. We administered ketorolac.  Meds ordered this encounter  Medications   SUMAtriptan (IMITREX) 50 MG tablet    Sig: Take 1 tablet (50 mg total) by mouth every 2 (two) hours as needed for migraine (may repeat in 2 hours as needed. Max 2 tabs/24 hrs). May repeat in 2 hours if headache persists or recurs.    Dispense:  10 tablet    Refill:  5    Order Specific Question:   Supervising Provider    Answer:   9/21 A [[id number]]   ondansetron (ZOFRAN) 4 MG tablet    Sig: Take 1 tablet (4 mg total) by mouth every 8 (eight) hours as needed for nausea or vomiting.    Dispense:  20 tablet    Refill:  0    Order Specific  Question:  Supervising Provider    Answer:   Danise Edge A [[id number]]   sertraline (ZOLOFT) 50 MG tablet    Sig: Take 1 tablet (50 mg total) by mouth daily.    Dispense:  90 tablet    Refill:  0    Order Specific Question:   Supervising Provider    Answer:   Danise Edge A [[id number]]   ketorolac (TORADOL) injection 60 mg

## 2021-08-11 NOTE — Assessment & Plan Note (Signed)
She would like to continue zoloft with goal of returning to work on 8/21. She is having some diarrhea which we discussed should improve in the next few weeks.

## 2021-08-11 NOTE — Assessment & Plan Note (Addendum)
Uncontrolled.  Toradol 60mg  IM here today.  Rx sent for prn imitrex and prn zofran. She is advised to keep scheduled appointment to re-establish with neurology in September.

## 2021-08-19 ENCOUNTER — Other Ambulatory Visit (HOSPITAL_COMMUNITY)
Admission: RE | Admit: 2021-08-19 | Discharge: 2021-08-19 | Disposition: A | Discharge status: Home / Self Care | Payer: BC Managed Care – PPO | Source: Ambulatory Visit | Attending: Obstetrics and Gynecology | Admitting: Obstetrics and Gynecology

## 2021-08-19 ENCOUNTER — Ambulatory Visit (INDEPENDENT_AMBULATORY_CARE_PROVIDER_SITE_OTHER): Payer: BC Managed Care – PPO | Admitting: Obstetrics and Gynecology

## 2021-08-19 ENCOUNTER — Encounter: Payer: Self-pay | Admitting: Obstetrics and Gynecology

## 2021-08-19 VITALS — BP 142/89 | HR 77 | Wt 191.0 lb

## 2021-08-19 DIAGNOSIS — Z202 Contact with and (suspected) exposure to infections with a predominantly sexual mode of transmission: Secondary | ICD-10-CM

## 2021-08-19 DIAGNOSIS — Z01419 Encounter for gynecological examination (general) (routine) without abnormal findings: Secondary | ICD-10-CM | POA: Insufficient documentation

## 2021-08-19 NOTE — Patient Instructions (Signed)

## 2021-08-19 NOTE — Progress Notes (Signed)
Pt presents for annual and all STD testing.  She requests to discuss Nexplanon insertion.   Last pap: 02/12/2013

## 2021-08-19 NOTE — Progress Notes (Signed)
Julie [name] is a 36 y.o. [phone] female here for a routine annual gynecologic exam.  Current complaints: None.   Denies abnormal vaginal bleeding, discharge, pelvic pain, problems with intercourse or other gynecologic concerns.    Gynecologic History Patient's last menstrual period was 08/04/2021. Contraception: none Last Pap: 2020. Results were: normal Last mammogram: NA.   Obstetric History OB History  Gravida Para Term Preterm AB Living  3 1   1 2 1   SAB IAB Ectopic Multiple Live Births    2     1    # Outcome Date GA Lbr Len/2nd Weight Sex Delivery Anes PTL Lv  3 Preterm 07/30/08 [redacted]w[redacted]d   F Vag-Spont  Y LIV  2 IAB           1 IAB             Past Medical History:  Diagnosis Date   Abnormal vaginal bleeding    Heart murmur    Migraine    Migraines     Past Surgical History:  Procedure Laterality Date   WISDOM TOOTH EXTRACTION      Current Outpatient Medications on File Prior to Visit  Medication Sig Dispense Refill   ondansetron (ZOFRAN) 4 MG tablet Take 1 tablet (4 mg total) by mouth every 8 (eight) hours as needed for nausea or vomiting. 20 tablet 0   sertraline (ZOLOFT) 50 MG tablet Take 1 tablet (50 mg total) by mouth daily. 90 tablet 0   SUMAtriptan (IMITREX) 50 MG tablet Take 1 tablet (50 mg total) by mouth every 2 (two) hours as needed for migraine (may repeat in 2 hours as needed. Max 2 tabs/24 hrs). May repeat in 2 hours if headache persists or recurs. 10 tablet 5   No current facility-administered medications on file prior to visit.    Allergies  Allergen Reactions   Acetaminophen Shortness Of Breath, Swelling and Rash   Codeine Shortness Of Breath, Swelling, Rash and Other (See Comments)    Hands swell   Hydrocodone    Strawberry Flavor    Other Hives and Rash    All seafood     Social History   Socioeconomic History   Marital status: Single    Spouse name: Not on file   Number of children: Not on file   Years of education: Not on file    Highest education level: Not on file  Occupational History   Not on file  Tobacco Use   Smoking status: Every Day    Packs/day: 0.50    Types: Cigarettes    Passive exposure: Never   Smokeless tobacco: Never  Substance and Sexual Activity   Alcohol use: Yes    Comment: socially    Drug use: Never   Sexual activity: Yes    Partners: Male    Birth control/protection: Condom  Other Topics Concern   Not on file  Social History Narrative   Not on file   Social Determinants of Health   Financial Resource Strain: Not on file  Food Insecurity: Not on file  Transportation Needs: Not on file  Physical Activity: Not on file  Stress: Not on file  Social Connections: Not on file  Intimate Partner Violence: Not on file    Family History  Problem Relation Age of Onset   Miscarriages / Stillbirths Mother    Hyperlipidemia Mother    Arthritis Mother    Diabetes Mother    Hypertension Mother    Diabetes Father  Alcohol abuse Father    Hypertension Paternal Aunt    Dementia Maternal Grandmother    Parkinson's disease Maternal Grandmother    Hypertension Maternal Grandfather    COPD Paternal Grandmother    Arthritis Paternal Grandmother    Heart disease Paternal Grandfather    COPD Paternal Grandfather    Cancer Paternal Grandfather     The following portions of the patient's history were reviewed and updated as appropriate: allergies, current medications, past family history, past medical history, past social history, past surgical history and problem list.  Review of Systems Pertinent items noted in HPI and remainder of comprehensive ROS otherwise negative.   Objective:  BP (!) 142/89   Pulse 77   Wt 191 lb (86.6 kg)   LMP 08/04/2021   BMI 29.91 kg/m  CONSTITUTIONAL: Well-developed, well-nourished female in no acute distress.  HENT:  Normocephalic, atraumatic, External right and left ear normal. Oropharynx is clear and moist EYES: Conjunctivae and EOM are normal.  Pupils are equal, round, and reactive to light. No scleral icterus.  NECK: Normal range of motion, supple, no masses.  Normal thyroid.  SKIN: Skin is warm and dry. No rash noted. Not diaphoretic. No erythema. No pallor. NEUROLGIC: Alert and oriented to person, place, and time. Normal reflexes, muscle tone coordination. No cranial nerve deficit noted. PSYCHIATRIC: Normal mood and affect. Normal behavior. Normal judgment and thought content. CARDIOVASCULAR: Normal heart rate noted, regular rhythm RESPIRATORY: Clear to auscultation bilaterally. Effort and breath sounds normal, no problems with respiration noted. BREASTS: Symmetric in size. No masses, skin changes, nipple drainage, or lymphadenopathy. ABDOMEN: Soft, normal bowel sounds, no distention noted.  No tenderness, rebound or guarding.  PELVIC: Normal appearing external genitalia; normal appearing vaginal mucosa and cervix.  No abnormal discharge noted.  Pap smear obtained.  Normal uterine size, no other palpable masses, no uterine or adnexal tenderness. MUSCULOSKELETAL: Normal range of motion. No tenderness.  No cyanosis, clubbing, or edema.  2+ distal pulses.   Assessment:  Annual gynecologic examination with pap smear STD Exposure Plan:  Will follow up results of pap smear and manage accordingly. STD testing as per pt request Routine preventative health maintenance measures emphasized. Please refer to After Visit Summary for other counseling recommendations.    Hermina Staggers, MD, FACOG Attending Obstetrician & Gynecologist Center for [facility], [facility]

## 2021-08-20 LAB — HEPATITIS B SURFACE ANTIGEN: Hepatitis B Surface Ag: NEGATIVE

## 2021-08-20 LAB — RPR: RPR Ser Ql: NONREACTIVE

## 2021-08-20 LAB — HEPATITIS C ANTIBODY: Hep C Virus Ab: NONREACTIVE

## 2021-08-20 LAB — HIV ANTIBODY (ROUTINE TESTING W REFLEX): HIV Screen 4th Generation wRfx: NONREACTIVE

## 2021-08-22 LAB — CERVICOVAGINAL ANCILLARY ONLY
Bacterial Vaginitis (gardnerella): POSITIVE — AB
Candida Glabrata: NEGATIVE
Candida Vaginitis: NEGATIVE
Chlamydia: NEGATIVE
Comment: NEGATIVE
Comment: NEGATIVE
Comment: NEGATIVE
Comment: NEGATIVE
Comment: NEGATIVE
Comment: NORMAL
Neisseria Gonorrhea: NEGATIVE
Trichomonas: POSITIVE — AB

## 2021-08-24 LAB — CYTOLOGY - PAP
Adequacy: ABSENT
Comment: NEGATIVE
Diagnosis: NEGATIVE
Diagnosis: REACTIVE
High risk HPV: NEGATIVE

## 2021-08-25 ENCOUNTER — Encounter: Payer: Self-pay | Admitting: Emergency Medicine

## 2021-08-25 ENCOUNTER — Telehealth: Payer: Self-pay | Admitting: Emergency Medicine

## 2021-08-25 DIAGNOSIS — B9689 Other specified bacterial agents as the cause of diseases classified elsewhere: Secondary | ICD-10-CM

## 2021-08-25 DIAGNOSIS — A599 Trichomoniasis, unspecified: Secondary | ICD-10-CM

## 2021-08-25 MED ORDER — METRONIDAZOLE 500 MG PO TABS: 500.0000 mg | ORAL_TABLET | Freq: Two times a day (BID) | ORAL | 0 refills | Status: DC

## 2021-08-25 NOTE — Telephone Encounter (Signed)
Attempted TC to patient to discuss results. Rx sent to pharmacy.

## 2021-09-02 ENCOUNTER — Ambulatory Visit: Payer: Self-pay | Admitting: Neurology

## 2021-09-20 NOTE — Progress Notes (Unsigned)
Referring:  [name], NP [id number] [place],  [place] [address]  PCP: Clayborne Dana, NP  Neurology was asked to evaluate Simmie Garin, a 36 year old female for a chief complaint of headaches.  Our recommendations of care will be communicated by shared medical record.    CC:  headaches  History provided from self  HPI:  Medical co-morbidities: anxiety  The patient presents for evaluation of migraines which began when she was 36 years old. Migraines are associated with photophobia, phonophobia, nausea, and vomiting. She will have a visual aura (white spots in her vision) prior to her migraines. They can last up to 2 weeks at a time.  She takes sumatriptan 50 mg as needed which helps take the edge off inconsistently. Sometimes it doesn't make a difference even with a second dose.  She has tried multiple preventives, though she does not remember the names of the medications. Tried Emgality most recently and didn't find it very effective, but states she has changed her lifestyle since then and would like to try it again.  Headache History: Onset: age 20 Triggers: chocolate, caffeine, heat Aura: floaters, white spots in vision Associated Symptoms:  Photophobia: yes  Phonophobia: yes  Nausea: yes Vomiting: yes Worse with activity?: yes Duration of headaches: up to 2 weeks  Headache days per month: 15 Headache free days per month: 15  Current Treatment: Abortive Imitrex 50 mg PRN Zofran  Preventative none  Prior Therapies                                 Imitrex 100 mg PRN Ubrelvy Zofran Topamax 25 mg TID - lack of efficacy Zoloft Botox - lack of efficacy Emgality Occipital nerve block  LABS: CBC    Component Value Date/Time   WBC 6.9 06/16/2021 1047   RBC 4.50 06/16/2021 1047   HGB 12.0 06/16/2021 1047   HCT 37.1 06/16/2021 1047   PLT 191.0 06/16/2021 1047   MCV 82.6 06/16/2021 1047   MCH 26.3 08/31/2015 1654   MCHC 32.4  06/16/2021 1047   RDW 14.3 06/16/2021 1047   LYMPHSABS 2.3 11/06/2013 1251   MONOABS 0.4 11/06/2013 1251   EOSABS 0.2 11/06/2013 1251   BASOSABS 0.0 11/06/2013 1251      Latest Ref Rng & Units 06/16/2021   10:47 AM 09/29/2014    2:06 PM 11/06/2013   12:51 PM  CMP  Glucose 70 - 99 mg/dL 60  629  67   BUN 6 - 23 mg/dL 19  9  12    Creatinine 0.40 - 1.20 mg/dL  5.28  4.13   Sodium 135 - 145 mEq/L 142  135  138   Potassium 3.5 - 5.1 mEq/L 4.1  4.2  4.0   Chloride 96 - 112 mEq/L 109  104  106   CO2 19 - 32 mEq/L 26  22  25    Calcium 8.4 - 10.5 mg/dL 9.5  9.3  9.2   Total Protein 6.0 - 8.3 g/dL 6.8   6.8   Total Bilirubin 0.2 - 1.2 mg/dL 0.3   0.5   Alkaline Phos 39 - 117 U/L 79   99   AST 0 - 37 U/L 15   32   ALT 0 - 35 U/L 13   36      IMAGING:  [facility] 2014: unremarkable  Imaging independently reviewed on September 21, 2021  Current Outpatient Medications on File Prior to Visit  Medication Sig Dispense Refill   metroNIDAZOLE (FLAGYL) 500 MG tablet Take 1 tablet (500 mg total) by mouth 2 (two) times daily. 14 tablet 0   ondansetron (ZOFRAN) 4 MG tablet Take 1 tablet (4 mg total) by mouth every 8 (eight) hours as needed for nausea or vomiting. 20 tablet 0   sertraline (ZOLOFT) 50 MG tablet Take 1 tablet (50 mg total) by mouth daily. 90 tablet 0   No current facility-administered medications on file prior to visit.     Allergies: Allergies  Allergen Reactions   Acetaminophen Shortness Of Breath, Swelling and Rash   Codeine Shortness Of Breath, Swelling, Rash and Other (See Comments)    Hands swell   Hydrocodone    Strawberry Flavor    Other Hives and Rash    All seafood     Family History: Migraine or other headaches in the family:  brother Aneurysms in a first degree relative:  no Brain tumors in the family:  no Other neurological illness in the family:   no  Past Medical History: Past Medical History:  Diagnosis Date   Abnormal vaginal bleeding    Heart  murmur    Migraine    Migraines     Past Surgical History Past Surgical History:  Procedure Laterality Date   WISDOM TOOTH EXTRACTION      Social History: Social History   Tobacco Use   Smoking status: Every Day    Packs/day: 0.50    Types: Cigarettes    Passive exposure: Never   Smokeless tobacco: Never  Substance Use Topics   Alcohol use: Yes    Comment: socially    Drug use: Never    ROS: Negative for fevers, chills. Positive for headaches. All other systems reviewed and negative unless stated otherwise in HPI.   Physical Exam:   Vital Signs: BP 124/77   Pulse 68   Ht 5\' 7"  (1.702 m)   Wt 187 lb (84.8 kg)   BMI 29.29 kg/m  GENERAL: well appearing,in no acute distress,alert SKIN:  Color, texture, turgor normal. No rashes or lesions HEAD:  Normocephalic/atraumatic. CV:  RRR RESP: Normal respiratory effort MSK: +tenderness to palpation over occiput, neck, and shoulders bilaterally  NEUROLOGICAL: Mental Status: Alert, oriented to person, place and time,Follows commands Cranial Nerves: PERRL, visual fields intact to confrontation, extraocular movements intact, facial sensation intact, no facial droop or ptosis, hearing grossly intact, no dysarthria Motor: muscle strength 5/5 both upper and lower extremities Reflexes: 2+ throughout Sensation: intact to light touch all 4 extremities Coordination: Finger-to- nose-finger intact bilaterally Gait: normal-based   IMPRESSION: 36 year old female with a history of anxiety who presents for evaluation of migraine with aura. She has failed multiple preventive medications previously. She would like to try Emgality again as she has changed her lifestyle and would like to see if it is more effective now. Will increase Imitrex to 100 mg PRN for rescue.  PLAN: -Preventive: Start Emgality 120 mg monthly. Loading dose given in office today -Rescue: Increase Imitrex to 100 mg PRN -Next steps: consider Ajovy, Aimovig, Vyepti,  Qulipta for prevention  I spent a total of 26 minutes chart reviewing and counseling the patient. Headache education was done. Discussed treatment options including preventive and acute medications. Discussed medication side effects, adverse reactions and drug interactions. Written educational materials and patient instructions outlining all of the above were given.  Follow-up: 3 months   Genia Harold, MD 09/21/2021  8:52 AM

## 2021-09-21 ENCOUNTER — Telehealth: Payer: Self-pay

## 2021-09-21 ENCOUNTER — Ambulatory Visit (INDEPENDENT_AMBULATORY_CARE_PROVIDER_SITE_OTHER): Payer: BC Managed Care – PPO | Admitting: Psychiatry

## 2021-09-21 ENCOUNTER — Encounter: Payer: Self-pay | Admitting: Psychiatry

## 2021-09-21 VITALS — BP 124/77 | HR 68 | Ht 67.0 in | Wt 187.0 lb

## 2021-09-21 DIAGNOSIS — G43111 Migraine with aura, intractable, with status migrainosus: Secondary | ICD-10-CM | POA: Diagnosis not present

## 2021-09-21 MED ORDER — SUMATRIPTAN SUCCINATE 100 MG PO TABS: 100.0000 mg | ORAL_TABLET | ORAL | 6 refills | Status: DC | PRN

## 2021-09-21 MED ORDER — EMGALITY 120 MG/ML ~~LOC~~ SOAJ: 120.0000 mg | SUBCUTANEOUS | 6 refills | Status: DC

## 2021-09-21 NOTE — Telephone Encounter (Signed)
PA has been sent to plan via CMM. Awaiting determination from [employer]. Key: [id number]

## 2021-09-22 ENCOUNTER — Other Ambulatory Visit (HOSPITAL_COMMUNITY)
Admission: RE | Admit: 2021-09-22 | Discharge: 2021-09-22 | Disposition: A | Discharge status: Home / Self Care | Payer: BC Managed Care – PPO | Source: Ambulatory Visit | Attending: Obstetrics and Gynecology | Admitting: Obstetrics and Gynecology

## 2021-09-22 ENCOUNTER — Ambulatory Visit (INDEPENDENT_AMBULATORY_CARE_PROVIDER_SITE_OTHER): Payer: BC Managed Care – PPO | Admitting: General Practice

## 2021-09-22 VITALS — BP 135/85 | HR 72 | Ht 67.0 in | Wt 188.8 lb

## 2021-09-22 DIAGNOSIS — B9689 Other specified bacterial agents as the cause of diseases classified elsewhere: Secondary | ICD-10-CM | POA: Insufficient documentation

## 2021-09-22 DIAGNOSIS — Z113 Encounter for screening for infections with a predominantly sexual mode of transmission: Secondary | ICD-10-CM | POA: Diagnosis not present

## 2021-09-22 DIAGNOSIS — N760 Acute vaginitis: Secondary | ICD-10-CM | POA: Diagnosis not present

## 2021-09-22 NOTE — Progress Notes (Signed)
SUBJECTIVE:  36 y.o. female presents for TOC. Pt tested positive for Trichomonas and BV 08-19-21. Pt has complete treatment and denies symptoms.  Denies abnormal vaginal bleeding or significant pelvic pain or fever. No UTI symptoms. Denies history of known exposure to STD.  No LMP recorded.  OBJECTIVE:  She appears well, afebrile. Urine dipstick: not done.  ASSESSMENT:  Vaginal Discharge  Vaginal Odor   PLAN:  GC, chlamydia, trichomonas, BVAG, CVAG probe sent to lab. Treatment: To be determined once lab results are received ROV prn if symptoms persist or worsen.

## 2021-09-22 NOTE — Telephone Encounter (Signed)
PA has been APPROVED. Coverage: 08/22/21-09/21/22. Case ID: [id number] Pharmacy notified.

## 2021-09-23 ENCOUNTER — Ambulatory Visit (INDEPENDENT_AMBULATORY_CARE_PROVIDER_SITE_OTHER): Payer: BC Managed Care – PPO | Admitting: Family

## 2021-09-23 DIAGNOSIS — G43009 Migraine without aura, not intractable, without status migrainosus: Secondary | ICD-10-CM

## 2021-09-23 DIAGNOSIS — G4700 Insomnia, unspecified: Secondary | ICD-10-CM | POA: Diagnosis not present

## 2021-09-23 DIAGNOSIS — F419 Anxiety disorder, unspecified: Secondary | ICD-10-CM | POA: Diagnosis not present

## 2021-09-23 LAB — CERVICOVAGINAL ANCILLARY ONLY
Bacterial Vaginitis (gardnerella): NEGATIVE
Candida Glabrata: NEGATIVE
Candida Vaginitis: NEGATIVE
Chlamydia: NEGATIVE
Comment: NEGATIVE
Comment: NEGATIVE
Comment: NEGATIVE
Comment: NEGATIVE
Comment: NEGATIVE
Comment: NORMAL
Neisseria Gonorrhea: NEGATIVE
Trichomonas: NEGATIVE

## 2021-09-23 MED ORDER — HYDROXYZINE PAMOATE 25 MG PO CAPS: 25.0000 mg | ORAL_CAPSULE | Freq: Three times a day (TID) | ORAL | 1 refills | Status: DC | PRN

## 2021-09-23 MED ORDER — ESCITALOPRAM OXALATE 10 MG PO TABS: 10.0000 mg | ORAL_TABLET | Freq: Every day | ORAL | 1 refills | Status: DC

## 2021-09-23 NOTE — Assessment & Plan Note (Signed)
Uncontrolled.  Will give trial of hydroxyzine 25mg  PO HS PRN.

## 2021-09-23 NOTE — Patient Instructions (Signed)
Stop zoloft, start lexapro. You may use hydroxyzine as needed for sleep or anxiety.

## 2021-09-23 NOTE — Assessment & Plan Note (Signed)
Improving following her visit with neurology. Imitrex was increased and emgality was added.

## 2021-09-23 NOTE — Assessment & Plan Note (Signed)
Uncontrolled. No significant improvement with sertraline 50mg .  Will try switching sertraline to lexapro to see if this has less insomnia.  Follow up in 6 weeks. Advised pt that she can also use hydroxyzine prn anxiety.

## 2021-09-23 NOTE — Progress Notes (Signed)
Subjective:   By signing my name below, I, Carylon Perches, attest that this documentation has been prepared under the direction and in the presence of [place], NP 09/23/2021   Patient ID: Julie [name], female    DOB: 1985-02-26, 36 y.o.   MRN: [id number]  Chief Complaint  Patient presents with   Follow-up    6 week    HPI Patient is in today for an office visit  Migraines: She has seen a neurologist since last visit. From last visit, her Sumatriptan was increased to 100 Mg and 120 Mg/mL of Emgaility was added to help alleviate her symptoms. She reports that since last visit, her symptoms are improving  Mood: She is experiencing some work stress. She is currently taking 50 Mg of Zoloft and is requesting an increase in dosage. She reports that she gets about 2 hours of sleep which is abnormal for her as she usually gets 4-5 hours of sleep a night. She experiences some stress and anxiety from work and other responsibilities. When she is at home, she is at peace. She is not seeing a counselor at this moment.   Immunizations: She is not interested in receiving an influenza vaccine during today's visit  Health Maintenance Due  Topic Date Due   HPV VACCINES (2 - 3-dose series) 04/04/2012   COVID-19 Vaccine (2 - Pfizer series) 03/09/2020    Past Medical History:  Diagnosis Date   Abnormal vaginal bleeding    Heart murmur    Migraine    Migraines     Past Surgical History:  Procedure Laterality Date   WISDOM TOOTH EXTRACTION      Family History  Problem Relation Age of Onset   Miscarriages / Stillbirths Mother    Hyperlipidemia Mother    Arthritis Mother    Diabetes Mother    Hypertension Mother    Diabetes Father    Alcohol abuse Father    Hypertension Paternal Aunt    Dementia Maternal Grandmother    Parkinson's disease Maternal Grandmother    Hypertension Maternal Grandfather    COPD Paternal Grandmother    Arthritis Paternal Grandmother    Heart  disease Paternal Grandfather    COPD Paternal Grandfather    Cancer Paternal Grandfather     Social History   Socioeconomic History   Marital status: Single    Spouse name: Not on file   Number of children: Not on file   Years of education: Not on file   Highest education level: Not on file  Occupational History   Not on file  Tobacco Use   Smoking status: Every Day    Packs/day: 0.50    Types: Cigarettes    Passive exposure: Never   Smokeless tobacco: Never  Substance and Sexual Activity   Alcohol use: Yes    Comment: socially    Drug use: Never   Sexual activity: Yes    Partners: Male    Birth control/protection: Condom  Other Topics Concern   Not on file  Social History Narrative   Not on file   Social Determinants of Health   Financial Resource Strain: Not on file  Food Insecurity: Not on file  Transportation Needs: Not on file  Physical Activity: Not on file  Stress: Not on file  Social Connections: Not on file  Intimate Partner Violence: Not on file    Outpatient Medications Prior to Visit  Medication Sig Dispense Refill   Galcanezumab-gnlm (EMGALITY) 120 MG/ML SOAJ Inject 120  mg into the skin every 30 (thirty) days. 1.12 mL 6   ondansetron (ZOFRAN) 4 MG tablet Take 1 tablet (4 mg total) by mouth every 8 (eight) hours as needed for nausea or vomiting. 20 tablet 0   SUMAtriptan (IMITREX) 100 MG tablet Take 1 tablet (100 mg total) by mouth every 2 (two) hours as needed for migraine. May repeat in 2 hours if headache persists or recurs. Max dose 2 pills in 24 hours 10 tablet 6   metroNIDAZOLE (FLAGYL) 500 MG tablet Take 1 tablet (500 mg total) by mouth 2 (two) times daily. (Patient not taking: Reported on 09/22/2021) 14 tablet 0   sertraline (ZOLOFT) 50 MG tablet Take 1 tablet (50 mg total) by mouth daily. 90 tablet 0   No facility-administered medications prior to visit.    Allergies  Allergen Reactions   Acetaminophen Shortness Of Breath, Swelling and Rash    Codeine Shortness Of Breath, Swelling, Rash and Other (See Comments)    Hands swell   Hydrocodone    Strawberry Flavor    Other Hives and Rash    All seafood     ROS See HPI    Objective:    Physical Exam Constitutional:      General: She is not in acute distress.    Appearance: Normal appearance. She is not ill-appearing.  HENT:     Head: Normocephalic and atraumatic.     Right Ear: External ear normal.     Left Ear: External ear normal.  Eyes:     Extraocular Movements: Extraocular movements intact.     Pupils: Pupils are equal, round, and reactive to light.  Cardiovascular:     Rate and Rhythm: Normal rate and regular rhythm.     Heart sounds: Normal heart sounds. No murmur heard.    No gallop.  Pulmonary:     Effort: Pulmonary effort is normal. No respiratory distress.     Breath sounds: Normal breath sounds. No wheezing or rales.  Skin:    General: Skin is warm and dry.  Neurological:     Mental Status: She is alert and oriented to person, place, and time.  Psychiatric:        Mood and Affect: Mood normal.        Behavior: Behavior normal.        Judgment: Judgment normal.     BP 117/69   Pulse 84   Resp 18   Ht 5\' 7"  (1.702 m)   Wt 190 lb (86.2 kg)   LMP 09/06/2021   SpO2 100%   BMI 29.76 kg/m  Wt Readings from Last 3 Encounters:  09/23/21 190 lb (86.2 kg)  09/22/21 188 lb 12.8 oz (85.6 kg)  09/21/21 187 lb (84.8 kg)       Assessment & Plan:   Problem List Items Addressed This Visit       Unprioritized   Migraine without aura    Improving following her visit with neurology. Imitrex was increased and emgality was added.        Relevant Medications   escitalopram (LEXAPRO) 10 MG tablet   Insomnia    Uncontrolled.  Will give trial of hydroxyzine 25mg  PO HS PRN.       Anxiety    Uncontrolled. No significant improvement with sertraline 50mg .  Will try switching sertraline to lexapro to see if this has less insomnia.  Follow up in 6 weeks.  Advised pt that she can also use hydroxyzine prn anxiety.  Relevant Medications   escitalopram (LEXAPRO) 10 MG tablet   hydrOXYzine (VISTARIL) 25 MG capsule   Meds ordered this encounter  Medications   escitalopram (LEXAPRO) 10 MG tablet    Sig: Take 1 tablet (10 mg total) by mouth daily.    Dispense:  30 tablet    Refill:  1    Order Specific Question:   Supervising Provider    Answer:   Danise Edge A [[id number]]   hydrOXYzine (VISTARIL) 25 MG capsule    Sig: Take 1 capsule (25 mg total) by mouth every 8 (eight) hours as needed for anxiety (or  insomnia).    Dispense:  30 capsule    Refill:  1    Order Specific Question:   Supervising Provider    Answer:   Danise Edge A [[id number]]    I, Lemont Fillers, NP, personally preformed the services described in this documentation.  All medical record entries made by the scribe were at my direction and in my presence.  I have reviewed the chart and discharge instructions (if applicable) and agree that the record reflects my personal performance and is accurate and complete. 09/23/2021   I,Amber Collins,acting as a scribe for Lemont Fillers, NP.,have documented all relevant documentation on the behalf of Lemont Fillers, NP,as directed by  Lemont Fillers, NP while in the presence of Lemont Fillers, NP.    Lemont Fillers, NP

## 2021-09-26 ENCOUNTER — Ambulatory Visit: Payer: Self-pay | Admitting: Neurology

## 2021-10-17 ENCOUNTER — Other Ambulatory Visit: Payer: Self-pay | Admitting: Family

## 2021-11-04 ENCOUNTER — Ambulatory Visit: Payer: BC Managed Care – PPO | Admitting: Family

## 2021-11-18 ENCOUNTER — Ambulatory Visit (INDEPENDENT_AMBULATORY_CARE_PROVIDER_SITE_OTHER): Payer: BC Managed Care – PPO | Admitting: Family

## 2021-11-18 VITALS — BP 132/70 | HR 92 | Temp 98.5°F | Resp 16 | Wt 188.0 lb

## 2021-11-18 DIAGNOSIS — G4700 Insomnia, unspecified: Secondary | ICD-10-CM | POA: Diagnosis not present

## 2021-11-18 DIAGNOSIS — F419 Anxiety disorder, unspecified: Secondary | ICD-10-CM

## 2021-11-18 MED ORDER — TRAZODONE HCL 50 MG PO TABS: ORAL_TABLET | ORAL | 1 refills | Status: DC

## 2021-11-18 MED ORDER — ESCITALOPRAM OXALATE 10 MG PO TABS: 10.0000 mg | ORAL_TABLET | Freq: Every day | ORAL | 0 refills | Status: DC

## 2021-11-18 NOTE — Assessment & Plan Note (Addendum)
Still having trouble staying asleep despite rx with hydroxyzine.  Will given trial of trazodone 1/2 to 1 tab po QHS.

## 2021-11-18 NOTE — Assessment & Plan Note (Signed)
Improved on lexapro. Continue same.  

## 2021-11-18 NOTE — Progress Notes (Signed)
Subjective:   By signing my name below, I, Julie [name], attest that this documentation has been prepared under the direction and in the presence of Julie Craze, NP. 11/18/2021     Patient ID: Julie [name], female    DOB: 05-17-1985, 36 y.o.   MRN: [id number]  Chief Complaint  Patient presents with   Migraine    Follow up, "doing better"   Anxiety    Follow up, "doing better"   Insomnia    Follow up, still having trouble staying sleep    Migraine  Associated symptoms include insomnia.  Anxiety Symptoms include insomnia and nervous/anxious behavior.    Insomnia   Patient is in today for a follow up visit.   Anxiety/Insomnia- During last visit she had insomnia and anxiety so her Sertraline was switched to lexapro. She reports no changes in her symptoms. She continues waking up early from her sleep. She typically wakes up after 3-4 hours of sleep and cannot go back to sleep. She has tried melatonin and teas to manage her sleep and headaches and found no change in her symptoms. She does not have caffeine in the afternoons. She occasionally drinks wine in the evenings but only during the weekends.   Migraines- Her migraines have improved since last visit. She seen her neurologist since her last visit and resumed a previous injection that did not work before but found it helped now. She's had 2 injections so far and reports finding relief.     Health Maintenance Due  Topic Date Due   HPV VACCINES (2 - 3-dose series) 04/04/2012   COVID-19 Vaccine (2 - Pfizer series) 03/09/2020    Past Medical History:  Diagnosis Date   Abnormal vaginal bleeding    Heart murmur    Migraine    Migraines     Past Surgical History:  Procedure Laterality Date   WISDOM TOOTH EXTRACTION      Family History  Problem Relation Age of Onset   Miscarriages / Stillbirths Mother    Hyperlipidemia Mother    Arthritis Mother    Diabetes Mother    Hypertension Mother    Diabetes  Father    Alcohol abuse Father    Hypertension Paternal Aunt    Dementia Maternal Grandmother    Parkinson's disease Maternal Grandmother    Hypertension Maternal Grandfather    COPD Paternal Grandmother    Arthritis Paternal Grandmother    Heart disease Paternal Grandfather    COPD Paternal Grandfather    Cancer Paternal Grandfather     Social History   Socioeconomic History   Marital status: Single    Spouse name: Not on file   Number of children: Not on file   Years of education: Not on file   Highest education level: Not on file  Occupational History   Not on file  Tobacco Use   Smoking status: Every Day    Packs/day: 0.50    Types: Cigarettes    Passive exposure: Never   Smokeless tobacco: Never  Substance and Sexual Activity   Alcohol use: Yes    Comment: socially    Drug use: Never   Sexual activity: Yes    Partners: Male    Birth control/protection: Condom  Other Topics Concern   Not on file  Social History Narrative   Not on file   Social Determinants of Health   Financial Resource Strain: Not on file  Food Insecurity: Not on file  Transportation Needs: Not on file  Physical Activity: Not on file  Stress: Not on file  Social Connections: Not on file  Intimate Partner Violence: Not on file    Outpatient Medications Prior to Visit  Medication Sig Dispense Refill   Galcanezumab-gnlm (EMGALITY) 120 MG/ML SOAJ Inject 120 mg into the skin every 30 (thirty) days. 1.12 mL 6   ondansetron (ZOFRAN) 4 MG tablet Take 1 tablet (4 mg total) by mouth every 8 (eight) hours as needed for nausea or vomiting. 20 tablet 0   SUMAtriptan (IMITREX) 100 MG tablet Take 1 tablet (100 mg total) by mouth every 2 (two) hours as needed for migraine. May repeat in 2 hours if headache persists or recurs. Max dose 2 pills in 24 hours 10 tablet 6   escitalopram (LEXAPRO) 10 MG tablet TAKE 1 TABLET BY MOUTH EVERY DAY 90 tablet 0   hydrOXYzine (VISTARIL) 25 MG capsule Take 1 capsule  (25 mg total) by mouth every 8 (eight) hours as needed for anxiety (or  insomnia). 30 capsule 1   No facility-administered medications prior to visit.    Allergies  Allergen Reactions   Acetaminophen Shortness Of Breath, Swelling and Rash   Codeine Shortness Of Breath, Swelling, Rash and Other (See Comments)    Hands swell   Hydrocodone    Strawberry Flavor    Other Hives and Rash    All seafood     Review of Systems  Psychiatric/Behavioral:  The patient is nervous/anxious and has insomnia.        Objective:    Physical Exam Constitutional:      General: She is not in acute distress.    Appearance: Normal appearance. She is not ill-appearing.  HENT:     Head: Normocephalic and atraumatic.     Right Ear: External ear normal.     Left Ear: External ear normal.  Eyes:     Extraocular Movements: Extraocular movements intact.     Pupils: Pupils are equal, round, and reactive to light.  Cardiovascular:     Rate and Rhythm: Normal rate and regular rhythm.     Heart sounds: Normal heart sounds. No murmur heard.    No gallop.  Pulmonary:     Effort: Pulmonary effort is normal. No respiratory distress.     Breath sounds: Normal breath sounds. No wheezing or rales.  Skin:    General: Skin is warm and dry.  Neurological:     Mental Status: She is alert and oriented to person, place, and time.  Psychiatric:        Judgment: Judgment normal.     BP 132/70 (BP Location: Right Arm, Patient Position: Sitting, Cuff Size: Small)   Pulse 92   Temp 98.5 F (36.9 C) (Oral)   Resp 16   Wt 188 lb (85.3 kg)   SpO2 99%   BMI 29.44 kg/m  Wt Readings from Last 3 Encounters:  11/18/21 188 lb (85.3 kg)  09/23/21 190 lb (86.2 kg)  09/22/21 188 lb 12.8 oz (85.6 kg)       Assessment & Plan:   Problem List Items Addressed This Visit       Unprioritized   Insomnia - Primary    Still having trouble staying asleep despite rx with hydroxyzine.  Will given trial of trazodone 1/2 to  1 tab po QHS.        Anxiety    Improved on lexapro. Continue same.       Relevant Medications   traZODone (DESYREL) 50 MG tablet  escitalopram (LEXAPRO) 10 MG tablet     Meds ordered this encounter  Medications   traZODone (DESYREL) 50 MG tablet    Sig: 1/2 to 1 tab by mouth HS prn.    Dispense:  30 tablet    Refill:  1    Order Specific Question:   Supervising Provider    Answer:   Penni Homans A [[id number]]   escitalopram (LEXAPRO) 10 MG tablet    Sig: Take 1 tablet (10 mg total) by mouth daily.    Dispense:  90 tablet    Refill:  0    Order Specific Question:   Supervising Provider    Answer:   Penni Homans A [[id number]]    I, Nance Pear, NP, personally preformed the services described in this documentation.  All medical record entries made by the scribe were at my direction and in my presence.  I have reviewed the chart and discharge instructions (if applicable) and agree that the record reflects my personal performance and is accurate and complete. 11/18/2021   I,Julie [name],acting as a Education administrator for Nance Pear, NP.,have documented all relevant documentation on the behalf of Nance Pear, NP,as directed by  Nance Pear, NP while in the presence of Nance Pear, NP.   Nance Pear, NP

## 2021-12-08 ENCOUNTER — Other Ambulatory Visit: Payer: Self-pay | Admitting: Psychiatry

## 2021-12-13 ENCOUNTER — Other Ambulatory Visit: Payer: Self-pay | Admitting: Family

## 2021-12-27 ENCOUNTER — Ambulatory Visit: Payer: BC Managed Care – PPO | Admitting: Family Medicine

## 2022-01-03 ENCOUNTER — Ambulatory Visit: Payer: BC Managed Care – PPO | Admitting: Family Medicine

## 2022-01-03 NOTE — Progress Notes (Deleted)
No chief complaint on file.   HISTORY OF PRESENT ILLNESS:  01/03/22 ALL:  Julie [name] is a 37 y.o. female here today for follow up for migraines. She was seen in consult with Dr Billey Gosling 09/2021 and started on Emgality. Sumatriptan increased to 100mg  PRN for abortive therapy. Since,    HISTORY (copied from Dr Georgina Peer previous note)  HPI:  Medical co-morbidities: anxiety   The patient presents for evaluation of migraines which began when she was 37 years old. Migraines are associated with photophobia, phonophobia, nausea, and vomiting. She will have a visual aura (white spots in her vision) prior to her migraines. They can last up to 2 weeks at a time.   She takes sumatriptan 50 mg as needed which helps take the edge off inconsistently. Sometimes it doesn't make a difference even with a second dose.   She has tried multiple preventives, though she does not remember the names of the medications. Tried Emgality most recently and didn't find it very effective, but states she has changed her lifestyle since then and would like to try it again.   Headache History: Onset: age 61 Triggers: chocolate, caffeine, heat Aura: floaters, white spots in vision Associated Symptoms:             Photophobia: yes             Phonophobia: yes             Nausea: yes Vomiting: yes Worse with activity?: yes Duration of headaches: up to 2 weeks   Headache days per month: 15 Headache free days per month: 15   Current Treatment: Abortive Imitrex 50 mg PRN Zofran   Preventative none   Prior Therapies                                 Imitrex 100 mg PRN Ubrelvy Zofran Topamax 25 mg TID - lack of efficacy Zoloft Botox - lack of efficacy Emgality Occipital nerve block   REVIEW OF SYSTEMS: Out of a complete 14 system review of symptoms, the patient complains only of the following symptoms, and all other reviewed systems are negative.   ALLERGIES: Allergies  Allergen Reactions    Acetaminophen Shortness Of Breath, Swelling and Rash   Codeine Shortness Of Breath, Swelling, Rash and Other (See Comments)    Hands swell   Hydrocodone    Strawberry Flavor    Other Hives and Rash    All seafood      HOME MEDICATIONS: Outpatient Medications Prior to Visit  Medication Sig Dispense Refill   EMGALITY 120 MG/ML SOAJ INJECT 120 MG INTO THE SKIN EVERY 30 (THIRTY) DAYS. 1.12 mL 0   escitalopram (LEXAPRO) 10 MG tablet Take 1 tablet (10 mg total) by mouth daily. 90 tablet 0   ondansetron (ZOFRAN) 4 MG tablet Take 1 tablet (4 mg total) by mouth every 8 (eight) hours as needed for nausea or vomiting. 20 tablet 0   SUMAtriptan (IMITREX) 100 MG tablet Take 1 tablet (100 mg total) by mouth every 2 (two) hours as needed for migraine. May repeat in 2 hours if headache persists or recurs. Max dose 2 pills in 24 hours 10 tablet 6   traZODone (DESYREL) 50 MG tablet Take 0.5-1 tablets (25-50 mg total) by mouth at bedtime as needed for sleep. 90 tablet 1   No facility-administered medications prior to visit.     PAST MEDICAL HISTORY:  Past Medical History:  Diagnosis Date   Abnormal vaginal bleeding    Heart murmur    Migraine    Migraines      PAST SURGICAL HISTORY: Past Surgical History:  Procedure Laterality Date   WISDOM TOOTH EXTRACTION       FAMILY HISTORY: Family History  Problem Relation Age of Onset   57 / Stillbirths Mother    Hyperlipidemia Mother    Arthritis Mother    Diabetes Mother    Hypertension Mother    Diabetes Father    Alcohol abuse Father    Hypertension Paternal Aunt    Dementia Maternal Grandmother    Parkinson's disease Maternal Grandmother    Hypertension Maternal Grandfather    COPD Paternal Grandmother    Arthritis Paternal Grandmother    Heart disease Paternal Grandfather    COPD Paternal Grandfather    Cancer Paternal Grandfather      SOCIAL HISTORY: Social History   Socioeconomic History   Marital status: Single     Spouse name: Not on file   Number of children: Not on file   Years of education: Not on file   Highest education level: Not on file  Occupational History   Not on file  Tobacco Use   Smoking status: Every Day    Packs/day: 0.50    Types: Cigarettes    Passive exposure: Never   Smokeless tobacco: Never  Substance and Sexual Activity   Alcohol use: Yes    Comment: socially    Drug use: Never   Sexual activity: Yes    Partners: Male    Birth control/protection: Condom  Other Topics Concern   Not on file  Social History Narrative   Not on file   Social Determinants of Health   Financial Resource Strain: Not on file  Food Insecurity: Not on file  Transportation Needs: Not on file  Physical Activity: Not on file  Stress: Not on file  Social Connections: Not on file  Intimate Partner Violence: Not on file     PHYSICAL EXAM  There were no vitals filed for this visit. There is no height or weight on file to calculate BMI.  Generalized: Well developed, in no acute distress  Cardiology: normal rate and rhythm, no murmur auscultated  Respiratory: clear to auscultation bilaterally    Neurological examination  Mentation: Alert oriented to time, place, history taking. Follows all commands speech and language fluent Cranial nerve II-XII: Pupils were equal round reactive to light. Extraocular movements were full, visual field were full on confrontational test. Facial sensation and strength were normal. Uvula tongue midline. Head turning and shoulder shrug  were normal and symmetric. Motor: The motor testing reveals 5 over 5 strength of all 4 extremities. Good symmetric motor tone is noted throughout.  Sensory: Sensory testing is intact to soft touch on all 4 extremities. No evidence of extinction is noted.  Coordination: Cerebellar testing reveals good finger-nose-finger and heel-to-shin bilaterally.  Gait and station: Gait is normal. Tandem gait is normal. Romberg is negative.  No drift is seen.  Reflexes: Deep tendon reflexes are symmetric and normal bilaterally.    DIAGNOSTIC DATA (LABS, IMAGING, TESTING) - I reviewed patient records, labs, notes, testing and imaging myself where available.  Lab Results  Component Value Date   WBC 6.9 06/16/2021   HGB 12.0 06/16/2021   HCT 37.1 06/16/2021   MCV 82.6 06/16/2021   PLT 191.0 06/16/2021      Component Value Date/Time   NA 142  06/16/2021 1047   K 4.1 06/16/2021 1047   CL 109 06/16/2021 1047   CO2 26 06/16/2021 1047   GLUCOSE 60 (L) 06/16/2021 1047   BUN 19 06/16/2021 1047   CREATININE 0.91 06/16/2021 1047   CREATININE 0.83 11/06/2013 1251   CALCIUM 9.5 06/16/2021 1047   PROT 6.8 06/16/2021 1047   ALBUMIN 4.4 06/16/2021 1047   AST 15 06/16/2021 1047   ALT 13 06/16/2021 1047   ALKPHOS 79 06/16/2021 1047   BILITOT 0.3 06/16/2021 1047   GFRNONAA >60 09/29/2014 1406   GFRNONAA >89 11/06/2013 1251   GFRAA >60 09/29/2014 1406   GFRAA >89 11/06/2013 1251   Lab Results  Component Value Date   CHOL 227 (H) 06/16/2021   HDL 38.80 (L) 06/16/2021   LDLCALC 164 (H) 06/16/2021   TRIG 118.0 06/16/2021   CHOLHDL 6 06/16/2021   Lab Results  Component Value Date   HGBA1C 5.8 06/16/2021   No results found for: "VITAMINB12" Lab Results  Component Value Date   TSH 0.88 06/16/2021        No data to display               No data to display           ASSESSMENT AND PLAN  37 y.o. year old female  has a past medical history of Abnormal vaginal bleeding, Heart murmur, Migraine, and Migraines. here with    No diagnosis found.  Alaisha A Broad ***.  Healthy lifestyle habits encouraged. *** will follow up with PCP as directed. *** will return to see me in ***, sooner if needed. *** verbalizes understanding and agreement with this plan.   No orders of the defined types were placed in this encounter.    No orders of the defined types were placed in this encounter.    Shawnie Dapper, MSN,  FNP-C 01/03/2022, 7:50 AM  [facility] [address] [place], [place] [address] [phone]

## 2022-01-11 ENCOUNTER — Telehealth: Payer: BC Managed Care – PPO | Admitting: Family

## 2022-01-11 DIAGNOSIS — R6889 Other general symptoms and signs: Secondary | ICD-10-CM | POA: Diagnosis not present

## 2022-01-11 MED ORDER — OSELTAMIVIR PHOSPHATE 75 MG PO CAPS: 75.0000 mg | ORAL_CAPSULE | Freq: Two times a day (BID) | ORAL | 0 refills | Status: DC

## 2022-01-11 NOTE — Progress Notes (Signed)
Virtual Visit Consent   [name], you are scheduled for a virtual visit with a [facility] provider today. Just as with appointments in the office, your consent must be obtained to participate. Your consent will be active for this visit and any virtual visit you may have with one of our providers in the next 365 days. If you have a MyChart account, a copy of this consent can be sent to you electronically.  As this is a virtual visit, video technology does not allow for your provider to perform a traditional examination. This may limit your provider's ability to fully assess your condition. If your provider identifies any concerns that need to be evaluated in person or the need to arrange testing (such as labs, EKG, etc.), we will make arrangements to do so. Although advances in technology are sophisticated, we cannot ensure that it will always work on either your end or our end. If the connection with a video visit is poor, the visit may have to be switched to a telephone visit. With either a video or telephone visit, we are not always able to ensure that we have a secure connection.  By engaging in this virtual visit, you consent to the provision of healthcare and authorize for your insurance to be billed (if applicable) for the services provided during this visit. Depending on your insurance coverage, you may receive a charge related to this service.  I need to obtain your verbal consent now. Are you willing to proceed with your visit today? [name] has provided verbal consent on 01/11/2022 for a virtual visit (video or telephone). Evelina Dun, FNP  Date: 01/11/2022 5:17 PM  Virtual Visit via Video Note   I, Evelina Dun, connected with  Julie [name]  ([id number], November 11, 1985) on 01/11/22 at  5:15 PM EST by a video-enabled telemedicine application and verified that I am speaking with the correct person using two identifiers.  Location: Patient: Virtual Visit Location  Patient: Home Provider: Virtual Visit Location Provider: Office/Clinic   I discussed the limitations of evaluation and management by telemedicine and the availability of in person appointments. The patient expressed understanding and agreed to proceed.    History of Present Illness: Julie [name] is a 37 y.o. who identifies as a female who was assigned female at birth, and is being seen today for sore throat and lose of voice that started 2 days ago. Home COVID test was negative.   HPI: URI  This is a new problem. The current episode started in the past 7 days. The problem has been gradually worsening. The maximum temperature recorded prior to her arrival was 101 - 101.9 F. Associated symptoms include congestion, ear pain, headaches, joint pain, sinus pain and a sore throat. Pertinent negatives include no coughing, nausea, rhinorrhea, sneezing or vomiting. Associated symptoms comments: Chills . She has tried acetaminophen and NSAIDs for the symptoms. The treatment provided mild relief.    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    Allergies:  Allergies  Allergen Reactions   Acetaminophen Shortness Of Breath, Swelling and Rash   Codeine Shortness Of Breath, Swelling,  Rash and Other (See Comments)    Hands swell   Hydrocodone    Strawberry Flavor    Other Hives and Rash    All seafood    Medications:  Current Outpatient Medications:    oseltamivir (TAMIFLU) 75 MG capsule, Take 1 capsule (75 mg total) by mouth 2 (two) times daily., Disp: 10 capsule, Rfl: 0   EMGALITY 120 MG/ML SOAJ, INJECT 120 MG INTO THE SKIN EVERY 30 (THIRTY) DAYS.,  Disp: 1.12 mL, Rfl: 0   escitalopram (LEXAPRO) 10 MG tablet, Take 1 tablet (10 mg total) by mouth daily., Disp: 90 tablet, Rfl: 0   ondansetron (ZOFRAN) 4 MG tablet, Take 1 tablet (4 mg total) by mouth every 8 (eight) hours as needed for nausea or vomiting., Disp: 20 tablet, Rfl: 0   SUMAtriptan (IMITREX) 100 MG tablet, Take 1 tablet (100 mg total) by mouth every 2 (two) hours as needed for migraine. May repeat in 2 hours if headache persists or recurs. Max dose 2 pills in 24 hours, Disp: 10 tablet, Rfl: 6   traZODone (DESYREL) 50 MG tablet, Take 0.5-1 tablets (25-50 mg total) by mouth at bedtime as needed for sleep., Disp: 90 tablet, Rfl: 1  Observations/Objective: Patient is well-developed, well-nourished in no acute distress.  Resting comfortably  at home.  Head is normocephalic, atraumatic.  No labored breathing.  Speech is clear and coherent with logical content.  Patient is alert and oriented at baseline.  Hoarse voice   Assessment and Plan: 1. Flu-like symptoms - oseltamivir (TAMIFLU) 75 MG capsule; Take 1 capsule (75 mg total) by mouth 2 (two) times daily.  Dispense: 10 capsule; Refill: 0  Force fluids Rest Alternate tylenol and motrin  Droplet precautions  Follow up if symptoms worsen or do not improve   Follow Up Instructions: I discussed the assessment and treatment plan with the patient. The patient was provided an opportunity to ask questions and all were answered. The patient agreed with the plan and demonstrated an understanding of the instructions.  A copy of instructions were sent to the patient via MyChart unless otherwise noted below.     The patient was advised to call back or seek an in-person evaluation if the symptoms worsen or if the condition fails to improve as anticipated.  Time:  I spent 7 minutes with the patient via telehealth technology discussing the above problems/concerns.    Evelina Dun, FNP

## 2022-01-11 NOTE — Patient Instructions (Signed)

## 2022-02-17 ENCOUNTER — Ambulatory Visit (INDEPENDENT_AMBULATORY_CARE_PROVIDER_SITE_OTHER): Payer: BC Managed Care – PPO | Admitting: Family Medicine

## 2022-02-17 ENCOUNTER — Telehealth: Payer: Self-pay

## 2022-02-17 ENCOUNTER — Encounter: Payer: Self-pay | Admitting: Family Medicine

## 2022-02-17 VITALS — BP 106/80 | HR 84 | Temp 97.7°F | Resp 16 | Ht 67.0 in | Wt 184.8 lb

## 2022-02-17 DIAGNOSIS — F419 Anxiety disorder, unspecified: Secondary | ICD-10-CM

## 2022-02-17 DIAGNOSIS — G4700 Insomnia, unspecified: Secondary | ICD-10-CM

## 2022-02-17 DIAGNOSIS — Z202 Contact with and (suspected) exposure to infections with a predominantly sexual mode of transmission: Secondary | ICD-10-CM

## 2022-02-17 MED ORDER — ESCITALOPRAM OXALATE 10 MG PO TABS: 10.0000 mg | ORAL_TABLET | Freq: Every day | ORAL | 0 refills | Status: DC

## 2022-02-17 MED ORDER — BELSOMRA 10 MG PO TABS: 10.0000 mg | ORAL_TABLET | Freq: Every evening | ORAL | 0 refills | Status: DC | PRN

## 2022-02-17 NOTE — Patient Instructions (Addendum)
Insomnia - no improvement with trazodone. We will try Belsomra if it isn't too expensive. I've attached the medication information sheet to your AVS. Do not mix with alcohol or other sleeping aids.

## 2022-02-17 NOTE — Progress Notes (Signed)
Established Patient Office Visit  Subjective   Patient ID: Julie [name], female    DOB: 05/04/1985  Age: 37 y.o. MRN: [id number]  Chief Complaint  Patient presents with   Follow-up    Medication  Still not sleeping    HPI  Patient is here for anxiety and insomnia follow-up. No other new complaints today.   Anxiety/insomnia: Patient last saw Debbrah Alar, NP on 11/18/21 for follow-up. She was reporting continued trouble falling asleep and staying asleep despite melatonin, teas, bedtime routine, no late caffeine, and trial of hydroxyzine. She was started on trial of trazodone 0.5-1 tab nightly as needed. Today patient reports the trazodone did not help at all. She is still only getting 2-4 hours of sleep a night. Some nights she has trouble falling asleep, but other times she is able to fall asleep, but will wake up within 2-3 hours and not be able to go back to sleep. She has been working really hard on lifestyle measures with no improvement.  Reports her mood has been really well controlled on Lexapro 10 mg. No SI/HI.       ROS All review of systems negative except what is listed in the HPI    Objective:     BP 106/80   Pulse 84   Temp 97.7 F (36.5 C)   Resp 16   Ht 5' 7"$  (1.702 m)   Wt 184 lb 12.8 oz (83.8 kg)   SpO2 100%   BMI 28.94 kg/m    Physical Exam Vitals reviewed.  Constitutional:      Appearance: Normal appearance.  Cardiovascular:     Rate and Rhythm: Normal rate and regular rhythm.     Pulses: Normal pulses.  Pulmonary:     Effort: Pulmonary effort is normal.     Breath sounds: Normal breath sounds.  Musculoskeletal:     Cervical back: Normal range of motion and neck supple. No tenderness.     Right lower leg: No edema.     Left lower leg: No edema.  Lymphadenopathy:     Cervical: No cervical adenopathy.  Skin:    General: Skin is warm and dry.  Neurological:     General: No focal deficit present.     Mental Status: She is  alert and oriented to person, place, and time. Mental status is at baseline.  Psychiatric:        Mood and Affect: Mood normal.        Behavior: Behavior normal.        Thought Content: Thought content normal.        Judgment: Judgment normal.      No results found for any visits on 02/17/22.    The ASCVD Risk score (Arnett DK, et al., 2019) failed to calculate for the following reasons:   The 2019 ASCVD risk score is only valid for ages 52 to 23    Assessment & Plan:   Problem List Items Addressed This Visit       Other   Insomnia - Primary    No improvement with trazodone. We will try Belsomra if it isn't too expensive. I've attached the medication information sheet to your AVS. Do not mix with alcohol or other sleeping aids.  PDMP reviewed. Safety discussed. She is aware that if it works and she continues to take we will need contract and UDS.       Relevant Medications   Suvorexant (BELSOMRA) 10 MG TABS   Anxiety  Well controlled on Lexapro. No SI/HI. Refill sent in.       Relevant Medications   escitalopram (LEXAPRO) 10 MG tablet       Return in about 3 months (around 05/18/2022) for routine follow-up, schedule CPE.    Terrilyn Saver, NP

## 2022-02-17 NOTE — Assessment & Plan Note (Signed)
Well controlled on Lexapro. No SI/HI. Refill sent in.

## 2022-02-17 NOTE — Assessment & Plan Note (Signed)
No improvement with trazodone. We will try Belsomra if it isn't too expensive. I've attached the medication information sheet to your AVS. Do not mix with alcohol or other sleeping aids.  PDMP reviewed. Safety discussed. She is aware that if it works and she continues to take we will need contract and UDS.

## 2022-02-17 NOTE — Telephone Encounter (Signed)
Belsomra not covered by plan. Preferred alternatives : eszopiclone tablets, generic ramelteon tablets, generic zaleplon capsules, generic zolpidem immediate-release tablets, generic zolpidem extended-release tablet, or generic zolpidem sublingual tablets

## 2022-02-20 ENCOUNTER — Other Ambulatory Visit: Payer: Self-pay

## 2022-02-20 DIAGNOSIS — G4700 Insomnia, unspecified: Secondary | ICD-10-CM

## 2022-02-20 MED ORDER — RAMELTEON 8 MG PO TABS: 8.0000 mg | ORAL_TABLET | Freq: Every day | ORAL | 0 refills | Status: DC

## 2022-02-20 MED ORDER — METRONIDAZOLE 500 MG PO TABS: 500.0000 mg | ORAL_TABLET | Freq: Two times a day (BID) | ORAL | 0 refills | Status: AC

## 2022-02-20 NOTE — Telephone Encounter (Signed)

## 2022-02-20 NOTE — Addendum Note (Signed)
Addended by: Caleen Jobs B on: 02/20/2022 02:23 PM   Modules accepted: Orders

## 2022-02-28 ENCOUNTER — Other Ambulatory Visit (HOSPITAL_COMMUNITY)
Admission: RE | Admit: 2022-02-28 | Discharge: 2022-02-28 | Disposition: A | Discharge status: Home / Self Care | Payer: BC Managed Care – PPO | Source: Ambulatory Visit | Attending: Family Medicine | Admitting: Family Medicine

## 2022-02-28 ENCOUNTER — Ambulatory Visit (INDEPENDENT_AMBULATORY_CARE_PROVIDER_SITE_OTHER): Payer: BC Managed Care – PPO | Admitting: Family Medicine

## 2022-02-28 ENCOUNTER — Encounter: Payer: Self-pay | Admitting: Family Medicine

## 2022-02-28 VITALS — BP 124/79 | HR 86 | Temp 97.8°F | Resp 18 | Ht 67.0 in | Wt 182.2 lb

## 2022-02-28 DIAGNOSIS — F419 Anxiety disorder, unspecified: Secondary | ICD-10-CM

## 2022-02-28 DIAGNOSIS — Z113 Encounter for screening for infections with a predominantly sexual mode of transmission: Secondary | ICD-10-CM | POA: Diagnosis not present

## 2022-02-28 DIAGNOSIS — G4700 Insomnia, unspecified: Secondary | ICD-10-CM | POA: Diagnosis not present

## 2022-02-28 MED ORDER — ESCITALOPRAM OXALATE 10 MG PO TABS: 10.0000 mg | ORAL_TABLET | Freq: Every day | ORAL | 0 refills | Status: DC

## 2022-02-28 MED ORDER — RAMELTEON 8 MG PO TABS: 8.0000 mg | ORAL_TABLET | Freq: Every day | ORAL | 0 refills | Status: DC

## 2022-02-28 NOTE — Progress Notes (Signed)
   Acute Office Visit  Subjective:     Patient ID: Julie [name], female    DOB: [birth year], 37 y.o.   MRN: [id number]  Chief Complaint  Patient presents with   std screen    HPI Patient is in today for STD screening.  Significant other positive for trich on 02/20/22, took treatment. She reached out via MyChart and was started on empiric treatment - Flagyl. She has completed treatment and has not had any symptoms. She would like to go ahead and with full STD screening. Denies any vaginal discharge, bleeding, pelvic pain, fevers, chills.      ROS All review of systems negative except what is listed in the HPI      Objective:    BP 124/79   Pulse 86   Temp 97.8 F (36.6 C)   Resp 18   Ht 5' 7"$  (1.702 m)   SpO2 97%   BMI 28.94 kg/m    Physical Exam Vitals reviewed.  Constitutional:      Appearance: Normal appearance.  Cardiovascular:     Rate and Rhythm: Normal rate and regular rhythm.  Pulmonary:     Effort: Pulmonary effort is normal.     Breath sounds: Normal breath sounds.  Abdominal:     General: Abdomen is flat. Bowel sounds are normal.     Tenderness: There is no abdominal tenderness. There is no right CVA tenderness, left CVA tenderness, guarding or rebound.  Skin:    General: Skin is warm and dry.  Neurological:     Mental Status: She is alert and oriented to person, place, and time.  Psychiatric:        Mood and Affect: Mood normal.        Behavior: Behavior normal.        Thought Content: Thought content normal.     No results found for any visits on 02/28/22.      Assessment & Plan:   Problem List Items Addressed This Visit       Other   Insomnia Previous script to wrong pharmacy. Resending.   Relevant Medications   ramelteon (ROZEREM) 8 MG tablet   Anxiety No SI/HI. Refill provided   Relevant Medications   escitalopram (LEXAPRO) 10 MG tablet   Other Visit Diagnoses     Screening for STD (sexually transmitted disease)    -   Primary Routine screening. Asymptomatic.    Relevant Orders   Cervicovaginal ancillary only   HIV Antibody (routine testing w rflx)   HSV(herpes simplex vrs) 1+2 ab-IgG   RPR       Meds ordered this encounter  Medications   ramelteon (ROZEREM) 8 MG tablet    Sig: Take 1 tablet (8 mg total) by mouth at bedtime.    Dispense:  30 tablet    Refill:  0    Order Specific Question:   Supervising Provider    Answer:   Penni Homans A [[id number]]   escitalopram (LEXAPRO) 10 MG tablet    Sig: Take 1 tablet (10 mg total) by mouth daily.    Dispense:  90 tablet    Refill:  0    Order Specific Question:   Supervising Provider    Answer:   Penni Homans A [[id number]]    Return if symptoms worsen or fail to improve.  Terrilyn Saver, NP

## 2022-03-01 LAB — HSV(HERPES SIMPLEX VRS) I + II AB-IGG
HAV 1 IGG,TYPE SPECIFIC AB: 30.8 index — ABNORMAL HIGH
HSV 2 IGG,TYPE SPECIFIC AB: <0.9 index

## 2022-03-01 LAB — HIV ANTIBODY (ROUTINE TESTING W REFLEX): HIV 1&2 Ab, 4th Generation: NONREACTIVE

## 2022-03-01 LAB — CERVICOVAGINAL ANCILLARY ONLY
Bacterial Vaginitis (gardnerella): NEGATIVE
Candida Glabrata: NEGATIVE
Candida Vaginitis: NEGATIVE
Chlamydia: NEGATIVE
Comment: NEGATIVE
Comment: NEGATIVE
Comment: NEGATIVE
Comment: NEGATIVE
Comment: NEGATIVE
Comment: NORMAL
Neisseria Gonorrhea: NEGATIVE
Trichomonas: NEGATIVE

## 2022-03-01 LAB — RPR: RPR Ser Ql: NONREACTIVE

## 2022-05-16 ENCOUNTER — Other Ambulatory Visit: Payer: Self-pay | Admitting: Psychiatry

## 2022-05-17 MED ORDER — EMGALITY 120 MG/ML ~~LOC~~ SOAJ: 120.0000 mg | SUBCUTANEOUS | 0 refills | Status: DC

## 2022-05-17 NOTE — Telephone Encounter (Signed)
Last seen on 09/21/21 per note " Preventive: Start Emgality 120 mg monthly. " No 3 month follow up scheduled

## 2022-06-12 ENCOUNTER — Encounter: Payer: BC Managed Care – PPO | Admitting: Family Medicine

## 2022-06-12 DIAGNOSIS — Z131 Encounter for screening for diabetes mellitus: Secondary | ICD-10-CM

## 2022-06-12 DIAGNOSIS — Z13220 Encounter for screening for lipoid disorders: Secondary | ICD-10-CM

## 2022-06-12 DIAGNOSIS — Z0000 Encounter for general adult medical examination without abnormal findings: Secondary | ICD-10-CM

## 2022-06-12 DIAGNOSIS — Z1329 Encounter for screening for other suspected endocrine disorder: Secondary | ICD-10-CM

## 2022-06-19 ENCOUNTER — Encounter: Payer: BC Managed Care – PPO | Admitting: Family Medicine

## 2022-06-28 ENCOUNTER — Ambulatory Visit (INDEPENDENT_AMBULATORY_CARE_PROVIDER_SITE_OTHER): Payer: BC Managed Care – PPO | Admitting: Family Medicine

## 2022-06-28 ENCOUNTER — Encounter: Payer: Self-pay | Admitting: Family Medicine

## 2022-06-28 ENCOUNTER — Telehealth: Payer: Self-pay | Admitting: Neurology

## 2022-06-28 VITALS — BP 135/81 | HR 92 | Ht 67.0 in | Wt 181.0 lb

## 2022-06-28 DIAGNOSIS — G4739 Other sleep apnea: Secondary | ICD-10-CM

## 2022-06-28 DIAGNOSIS — Z1329 Encounter for screening for other suspected endocrine disorder: Secondary | ICD-10-CM | POA: Diagnosis not present

## 2022-06-28 DIAGNOSIS — Z0000 Encounter for general adult medical examination without abnormal findings: Secondary | ICD-10-CM | POA: Diagnosis not present

## 2022-06-28 DIAGNOSIS — E162 Hypoglycemia, unspecified: Secondary | ICD-10-CM

## 2022-06-28 DIAGNOSIS — Z131 Encounter for screening for diabetes mellitus: Secondary | ICD-10-CM | POA: Diagnosis not present

## 2022-06-28 DIAGNOSIS — G43009 Migraine without aura, not intractable, without status migrainosus: Secondary | ICD-10-CM

## 2022-06-28 DIAGNOSIS — Z13220 Encounter for screening for lipoid disorders: Secondary | ICD-10-CM

## 2022-06-28 LAB — TSH: TSH: 0.95 u[IU]/mL (ref 0.35–5.50)

## 2022-06-28 LAB — COMPREHENSIVE METABOLIC PANEL
ALT: 12 U/L (ref 0–35)
AST: 18 U/L (ref 0–37)
Albumin: 4.2 g/dL (ref 3.5–5.2)
Alkaline Phosphatase: 65 U/L (ref 39–117)
BUN: 17 mg/dL (ref 6–23)
CO2: 27 mEq/L (ref 19–32)
Calcium: 9.6 mg/dL (ref 8.4–10.5)
Chloride: 107 mEq/L (ref 96–112)
Creatinine, Ser: 0.91 mg/dL (ref 0.40–1.20)
GFR: 82.3 mL/min (ref >60.00)
Glucose, Bld: 49 mg/dL — CL (ref 70–99)
Potassium: 4 mEq/L (ref 3.5–5.1)
Sodium: 140 mEq/L (ref 135–145)
Total Bilirubin: 0.5 mg/dL (ref 0.2–1.2)
Total Protein: 6.7 g/dL (ref 6.0–8.3)

## 2022-06-28 LAB — CBC WITH DIFFERENTIAL/PLATELET
Basophils Absolute: 0.1 10*3/uL (ref 0.0–0.1)
Basophils Relative: 0.8 % (ref 0.0–3.0)
Eosinophils Absolute: 0.2 10*3/uL (ref 0.0–0.7)
Eosinophils Relative: 2.8 % (ref 0.0–5.0)
HCT: 38.9 % (ref 36.0–46.0)
Hemoglobin: 12.5 g/dL (ref 12.0–15.0)
Lymphocytes Relative: 34.1 % (ref 12.0–46.0)
Lymphs Abs: 2.2 10*3/uL (ref 0.7–4.0)
MCHC: 32.1 g/dL (ref 30.0–36.0)
MCV: 83.2 fl (ref 78.0–100.0)
Monocytes Absolute: 0.3 10*3/uL (ref 0.1–1.0)
Monocytes Relative: 5.4 % (ref 3.0–12.0)
Neutro Abs: 3.6 10*3/uL (ref 1.4–7.7)
Neutrophils Relative %: 56.9 % (ref 43.0–77.0)
Platelets: 216 10*3/uL (ref 150.0–400.0)
RBC: 4.68 Mil/uL (ref 3.87–5.11)
RDW: 14.9 % (ref 11.5–15.5)
WBC: 6.3 10*3/uL (ref 4.0–10.5)

## 2022-06-28 LAB — LIPID PANEL
Cholesterol: 223 mg/dL — ABNORMAL HIGH (ref 0–200)
HDL: 40.8 mg/dL (ref >39.00)
LDL Cholesterol: 159 mg/dL — ABNORMAL HIGH (ref 0–99)
NonHDL: 182.4
Total CHOL/HDL Ratio: 5
Triglycerides: 119 mg/dL (ref 0.0–149.0)
VLDL: 23.8 mg/dL (ref 0.0–40.0)

## 2022-06-28 MED ORDER — SUMATRIPTAN SUCCINATE 100 MG PO TABS
100.0000 mg | ORAL_TABLET | ORAL | 6 refills | Status: AC | PRN
Start: 2022-06-28 — End: ?

## 2022-06-28 MED ORDER — EMGALITY 120 MG/ML ~~LOC~~ SOAJ
120.0000 mg | SUBCUTANEOUS | 1 refills | Status: DC
Start: 2022-06-28 — End: 2023-01-19

## 2022-06-28 NOTE — Assessment & Plan Note (Signed)
Stable on current regimen.  Refills provided. 

## 2022-06-28 NOTE — Telephone Encounter (Signed)
CRITICAL VALUE STICKER  CRITICAL VALUE: Glucose 49  MESSENGER (representative from lab): Saa

## 2022-06-28 NOTE — Progress Notes (Signed)
Complete physical exam  Patient: Julie [name]   DOB: [birth year]   37 y.o. Female  MRN: [id number]  Subjective:    Chief Complaint  Patient presents with   Annual Exam    Julie [name] is a 37 y.o. female who presents today for a complete physical exam. She reports consuming a general diet. Home exercise routine includes walking. She generally feels well. She reports sleeping poorly. She does have additional problems to discuss today.   Currently lives with: daughter and mom Acute concerns or interim problems since last visit:  - Migraines are controlled on current regimen, but neuro appointments are expensive. She is asking me to take over her migraine management. - She is still having trouble with sleep-apnea like symptoms. We ordered a sleep study last year, but she never had it done. She has been practicing good sleep hygiene, but still having very restless sleep, getting maybe 2 hours of sleep a night. She tosses and turns a lot, thinks she snores, observed apnea, has to wake up and catch her breath sometimes.  - She also reports a "crick in my neck" that she is planning to try heat ,massage, etc and will let me know if not improving   Vision concerns: no concerns, sees eye doctor regularly Dental concerns: no concerns, sees dentist regularly STD concerns: no concerns, but would like routine screening today   Patient endorses ETOH use.- occasional Patient endorses nicotine use. - less than half a pack per day  Patient endorses illegal substance use. - CBD occasionally    Females:  She is currently  sexually active with female She denies  concerns today about STI Contraception choices are: none LMP: 06/17/22      Most recent fall risk assessment:    06/28/2022    8:42 AM  Fall Risk   Falls in the past year? 0  Number falls in past yr: 0  Injury with Fall? 0  Risk for fall due to : No Fall Risks  Follow up Falls evaluation completed     Most recent  depression screenings:    08/19/2021    8:21 AM 08/02/2021    4:29 PM  PHQ 2/9 Scores  PHQ - 2 Score 0 0  PHQ- 9 Score 0 11       08/19/2021    8:21 AM 08/02/2021    4:29 PM  GAD 7 : Generalized Anxiety Score  Nervous, Anxious, on Edge 1 3  Control/stop worrying 0 1  Worry too much - different things 0 2  Trouble relaxing 0 3  Restless 0 2  Easily annoyed or irritable 0 3  Afraid - awful might happen 0 3  Total GAD 7 Score 1 17  Anxiety Difficulty Somewhat difficult Very difficult   - She feels that if she can get her sleep straightened out then she will be okay. Not interested in anxiety/depression management right now.          Patient Care Team: Clayborne Dana, NP as PCP - General (Family Medicine)   Outpatient Medications Prior to Visit  Medication Sig   escitalopram (LEXAPRO) 10 MG tablet Take 1 tablet (10 mg total) by mouth daily.   ondansetron (ZOFRAN) 4 MG tablet Take 1 tablet (4 mg total) by mouth every 8 (eight) hours as needed for nausea or vomiting.   [DISCONTINUED] Galcanezumab-gnlm (EMGALITY) 120 MG/ML SOAJ Inject 120 mg into the skin every 30 (thirty) days.   [DISCONTINUED] SUMAtriptan (IMITREX) 100 MG  tablet Take 1 tablet (100 mg total) by mouth every 2 (two) hours as needed for migraine. May repeat in 2 hours if headache persists or recurs. Max dose 2 pills in 24 hours   [DISCONTINUED] ramelteon (ROZEREM) 8 MG tablet Take 1 tablet (8 mg total) by mouth at bedtime.   No facility-administered medications prior to visit.    ROS All review of systems negative except what is listed in the HPI        Objective:     BP 135/81   Pulse 92   Ht 5\' 7"  (1.702 m)   Wt 181 lb (82.1 kg)   SpO2 100%   BMI 28.35 kg/m    Physical Exam Vitals reviewed.  Constitutional:      General: She is not in acute distress.    Appearance: Normal appearance. She is not ill-appearing.  HENT:     Head: Normocephalic and atraumatic.     Right Ear: Tympanic membrane  normal.     Left Ear: Tympanic membrane normal.     Nose: Nose normal.     Mouth/Throat:     Mouth: Mucous membranes are moist.     Pharynx: Oropharynx is clear.  Eyes:     Extraocular Movements: Extraocular movements intact.     Conjunctiva/sclera: Conjunctivae normal.     Pupils: Pupils are equal, round, and reactive to light.  Neck:     Vascular: No carotid bruit.  Cardiovascular:     Rate and Rhythm: Normal rate and regular rhythm.     Pulses: Normal pulses.     Heart sounds: Normal heart sounds.  Pulmonary:     Effort: Pulmonary effort is normal.     Breath sounds: Normal breath sounds.  Abdominal:     General: Abdomen is flat. Bowel sounds are normal. There is no distension.     Palpations: Abdomen is soft. There is no mass.     Tenderness: There is no abdominal tenderness. There is no right CVA tenderness, left CVA tenderness, guarding or rebound.  Genitourinary:    Comments: Deferred exam Musculoskeletal:        General: Normal range of motion.     Cervical back: Normal range of motion and neck supple. No tenderness.     Right lower leg: No edema.     Left lower leg: No edema.     Comments: Left upper traps very tight  Lymphadenopathy:     Cervical: No cervical adenopathy.  Skin:    General: Skin is warm and dry.     Capillary Refill: Capillary refill takes less than 2 seconds.  Neurological:     General: No focal deficit present.     Mental Status: She is alert and oriented to person, place, and time. Mental status is at baseline.  Psychiatric:        Mood and Affect: Mood normal.        Behavior: Behavior normal.        Thought Content: Thought content normal.        Judgment: Judgment normal.           No results found for any visits on 06/28/22.     Assessment & Plan:    Routine Health Maintenance and Physical Exam Discussed health promotion and safety including diet and exercise recommendations, dental health, and injury prevention. Tobacco  cessation if applicable. Seat belts, sunscreen, smoke detectors, etc.    Immunization History  Administered Date(s) Administered   HPV Quadrivalent 03/07/2012  Influenza Split 03/07/2012   Influenza,inj,Quad PF,6+ Mos 11/06/2013   PFIZER(Purple Top)SARS-COV-2 Vaccination 04/11/2019, 05/06/2019, 01/13/2020   Td 07/05/2018    Health Maintenance  Topic Date Due   HPV VACCINES (2 - 3-dose series) 04/04/2012   COVID-19 Vaccine (4 - 2023-24 season) 06/09/2023 (Originally 09/02/2021)   INFLUENZA VACCINE  08/03/2022   PAP SMEAR-Modifier  08/19/2024   DTaP/Tdap/Td (2 - Tdap) 07/04/2028   Hepatitis C Screening  Completed   HIV Screening  Completed        Problem List Items Addressed This Visit     Migraine without aura    Stable on current regimen. Refills provided       Relevant Medications   Galcanezumab-gnlm (EMGALITY) 120 MG/ML SOAJ   SUMAtriptan (IMITREX) 100 MG tablet   Other Visit Diagnoses     Annual physical exam    -  Primary   Relevant Orders   CBC with Differential/Platelet   Comprehensive metabolic panel   Lipid panel   TSH   Lipid screening       Relevant Orders   Lipid panel   Thyroid disorder screen       Relevant Orders   TSH   Diabetes mellitus screening       Relevant Orders   Comprehensive metabolic panel   Sleep apnea-like behavior       Relevant Orders   Ambulatory referral to Sleep Studies      Return in about 1 year (around 06/28/2023) for physical.     Clayborne Dana, NP

## 2022-06-28 NOTE — Telephone Encounter (Signed)
Spoke with patient, documented in results.  

## 2022-07-03 ENCOUNTER — Other Ambulatory Visit (INDEPENDENT_AMBULATORY_CARE_PROVIDER_SITE_OTHER): Payer: BC Managed Care – PPO

## 2022-07-03 DIAGNOSIS — E162 Hypoglycemia, unspecified: Secondary | ICD-10-CM

## 2022-07-03 LAB — GLUCOSE, RANDOM: Glucose, Bld: 79 mg/dL (ref 70–99)

## 2022-07-03 NOTE — Addendum Note (Signed)
Addended by: Mervin Kung A on: 07/03/2022 07:49 AM   Modules accepted: Orders

## 2022-07-10 DIAGNOSIS — G4700 Insomnia, unspecified: Secondary | ICD-10-CM | POA: Diagnosis not present

## 2022-07-10 DIAGNOSIS — G4719 Other hypersomnia: Secondary | ICD-10-CM | POA: Diagnosis not present

## 2022-07-10 DIAGNOSIS — G4753 Recurrent isolated sleep paralysis: Secondary | ICD-10-CM | POA: Diagnosis not present

## 2022-07-10 DIAGNOSIS — G4720 Circadian rhythm sleep disorder, unspecified type: Secondary | ICD-10-CM | POA: Diagnosis not present

## 2022-08-23 DIAGNOSIS — G4720 Circadian rhythm sleep disorder, unspecified type: Secondary | ICD-10-CM | POA: Diagnosis not present

## 2022-08-23 DIAGNOSIS — G4700 Insomnia, unspecified: Secondary | ICD-10-CM | POA: Diagnosis not present

## 2022-08-23 DIAGNOSIS — G4719 Other hypersomnia: Secondary | ICD-10-CM | POA: Diagnosis not present

## 2022-08-23 DIAGNOSIS — G4753 Recurrent isolated sleep paralysis: Secondary | ICD-10-CM | POA: Diagnosis not present

## 2022-09-14 DIAGNOSIS — F17210 Nicotine dependence, cigarettes, uncomplicated: Secondary | ICD-10-CM | POA: Diagnosis not present

## 2022-09-14 DIAGNOSIS — G4733 Obstructive sleep apnea (adult) (pediatric): Secondary | ICD-10-CM | POA: Diagnosis not present

## 2022-09-14 DIAGNOSIS — G4720 Circadian rhythm sleep disorder, unspecified type: Secondary | ICD-10-CM | POA: Diagnosis not present

## 2022-09-14 DIAGNOSIS — G4753 Recurrent isolated sleep paralysis: Secondary | ICD-10-CM | POA: Diagnosis not present

## 2022-09-15 ENCOUNTER — Ambulatory Visit: Payer: BC Managed Care – PPO | Admitting: Family Medicine

## 2022-09-21 ENCOUNTER — Encounter: Payer: Self-pay | Admitting: Family Medicine

## 2022-09-21 DIAGNOSIS — G4733 Obstructive sleep apnea (adult) (pediatric): Secondary | ICD-10-CM | POA: Insufficient documentation

## 2022-10-31 ENCOUNTER — Encounter: Payer: Self-pay | Admitting: Family Medicine

## 2022-10-31 ENCOUNTER — Ambulatory Visit (INDEPENDENT_AMBULATORY_CARE_PROVIDER_SITE_OTHER): Payer: BC Managed Care – PPO | Admitting: Family Medicine

## 2022-10-31 VITALS — BP 121/80 | HR 96 | Temp 97.9°F | Ht 67.0 in | Wt 183.0 lb

## 2022-10-31 DIAGNOSIS — J069 Acute upper respiratory infection, unspecified: Secondary | ICD-10-CM

## 2022-10-31 DIAGNOSIS — R6889 Other general symptoms and signs: Secondary | ICD-10-CM

## 2022-10-31 LAB — POC COVID19 BINAXNOW: SARS Coronavirus 2 Ag: NEGATIVE

## 2022-10-31 LAB — POCT RAPID STREP A (OFFICE): Rapid Strep A Screen: NEGATIVE

## 2022-10-31 LAB — POCT INFLUENZA A/B
Influenza A, POC: NEGATIVE
Influenza B, POC: NEGATIVE

## 2022-10-31 MED ORDER — FLUTICASONE PROPIONATE 50 MCG/ACT NA SUSP
2.0000 | Freq: Every day | NASAL | 6 refills | Status: DC
Start: 2022-10-31 — End: 2023-01-19

## 2022-10-31 MED ORDER — GUAIFENESIN ER 600 MG PO TB12
1200.0000 mg | ORAL_TABLET | Freq: Two times a day (BID) | ORAL | 0 refills | Status: DC
Start: 2022-10-31 — End: 2023-01-19

## 2022-10-31 MED ORDER — BENZONATATE 200 MG PO CAPS
200.0000 mg | ORAL_CAPSULE | Freq: Two times a day (BID) | ORAL | 0 refills | Status: DC | PRN
Start: 2022-10-31 — End: 2023-01-19

## 2022-10-31 NOTE — Patient Instructions (Signed)
COVID, Flu, and Strep negative today Likely another viral infection.  Continue supportive measures including rest, hydration, humidifier use, steam showers, warm compresses to sinuses, warm liquids with lemon and honey, and over-the-counter cough, cold, and analgesics as needed.  If symptoms persist past 8-10 days, become more severe, or you feel better for a few days and then symptoms worsen again, please follow-up for additional treatments.   The following information is provided as a Counsellor for ADULT patients only and does NOT take into account PREGNANCY, ALLERGIES, LIVER CONDITIONS, KIDNEY CONDITIONS, GASTROINTESTINAL CONDITIONS, OR PRESCRIPTION MEDICATION INTERACTIONS. Please be sure to ask your provider if the following are safe to take with your specific medical history, conditions, or current medication regimen if you are unsure.   Adult Basic Symptom Management  Congestion: Guaifenesin (Mucinex)- follow directions on packaging with a maximum dose of 2400mg  in a 24 hour period.  Pain/Fever: Ibuprofen 200mg  - 400mg  every 4-6 hours as needed (MAX 1200mg  in a 24 hour period) Pain/Fever: Tylenol 500mg  -1000mg  every 6-8 hours as needed (MAX 3000mg  in a 24 hour period)  Cough: Dextromethorphan (Delsym)- follow directions on packing with a maximum dose of 120mg  in a 24 hour period.  Nasal Stuffiness: Saline nasal spray and/or Nettie Pot with sterile saline solution  Runny Nose: Fluticasone nasal spray (Flonase) OR Mometasone nasal spray (Nasonex) OR Triamcinolone Acetonide nasal spray (Nasacort)- follow directions on the packaging  Pain/Pressure: Warm washcloth to the face  Sore Throat: Warm salt water gargles  If you have allergies, you may also consider taking an oral antihistamine (like Zyrtec or Claritin) as these may also help with your symptoms.  **Many medications will have more than one ingredient, be sure you are reading the packaging carefully and not taking more than  one dose of the same kind of medication at the same time or too close together. It is OK to use formulas that have all of the ingredients you want, but do not take them in a combined medication and as separate dose too close together. If you have any questions, the pharmacist will be happy to help you decide what is safe.

## 2022-10-31 NOTE — Progress Notes (Signed)
Acute Office Visit  Subjective:     Patient ID: Julie [name], female    DOB: 01-15-1985, 37 y.o.   MRN: [id number]  Chief Complaint  Patient presents with   Generalized Body Aches   Nasal Congestion   Sore Throat    HPI Patient is in today for flu-like symptoms.   Discussed the use of AI scribe software for clinical note transcription with the patient, who gave verbal consent to proceed.  History of Present Illness   The patient presents with a constellation of symptoms that began approximately three to four days ago. Initially, the symptoms were suggestive of an allergies, characterized by congestion, runny nose, and watery eyes, particularly noticeable at night and upon transitioning from outdoor to indoor environments.  However, the patient's condition evolved to include systemic symptoms. They reported a fever peaking around 100 degrees Fahrenheit, experienced yesterday, accompanied by body aches and a general feeling of malaise. The patient also noted difficulty sleeping, with an episode of insomnia the previous night.  Respiratory symptoms have also emerged, including a dry cough and sneezing. The patient reported a sensation of pressure behind the ears and mild dyspnea with exertion and fatigue. Despite these symptoms, the patient denied any significant trouble breathing.  The patient also reported feelings of weakness, which began on the day of the consultation. They experienced some nausea but denied any episodes of vomiting or diarrhea. The patient reported some postnasal drainage but did not consider it excessive.  In terms of self-management, the patient has not taken any over-the-counter medications due to concerns about potential interactions with their current medication regimen. Instead, they have attempted to manage their symptoms with natural remedies, including honey, lemon, and ginger.             ROS All review of systems negative except what is  listed in the HPI      Objective:    BP 121/80   Pulse 96   Temp 97.9 F (36.6 C) (Oral)   Ht 5\' 7"  (1.702 m)   Wt 183 lb (83 kg)   SpO2 100%   BMI 28.66 kg/m    Physical Exam Vitals reviewed.  Constitutional:      Appearance: Normal appearance.  HENT:     Head: Normocephalic and atraumatic.     Comments: Hoarse voice    Right Ear: Tympanic membrane normal.     Left Ear: Tympanic membrane normal.     Nose: Congestion and rhinorrhea present.     Mouth/Throat:     Mouth: Mucous membranes are moist.     Pharynx: Oropharynx is clear. No oropharyngeal exudate or posterior oropharyngeal erythema.  Eyes:     Conjunctiva/sclera: Conjunctivae normal.  Cardiovascular:     Rate and Rhythm: Normal rate and regular rhythm.  Pulmonary:     Effort: Pulmonary effort is normal.     Breath sounds: Normal breath sounds.  Abdominal:     General: Abdomen is flat. Bowel sounds are normal.     Tenderness: There is no abdominal tenderness. There is no right CVA tenderness, left CVA tenderness, guarding or rebound.  Musculoskeletal:     Cervical back: Normal range of motion and neck supple. No tenderness.  Lymphadenopathy:     Cervical: No cervical adenopathy.  Skin:    General: Skin is warm and dry.  Neurological:     Mental Status: She is alert and oriented to person, place, and time.  Psychiatric:  Mood and Affect: Mood normal.        Behavior: Behavior normal.        Thought Content: Thought content normal.     Results for orders placed or performed in visit on 10/31/22  POC COVID-19 BinaxNow  Result Value Ref Range   SARS Coronavirus 2 Ag Negative Negative  POCT rapid strep A  Result Value Ref Range   Rapid Strep A Screen Negative Negative  POCT Influenza A/B  Result Value Ref Range   Influenza A, POC Negative Negative   Influenza B, POC Negative Negative        Assessment & Plan:   Problem List Items Addressed This Visit   None Visit Diagnoses     Viral  URI    -  Primary   Relevant Medications   benzonatate (TESSALON) 200 MG capsule   guaiFENesin (MUCINEX) 600 MG 12 hr tablet   fluticasone (FLONASE) 50 MCG/ACT nasal spray   Flu-like symptoms       Relevant Orders   POC COVID-19 BinaxNow (Completed)   POCT rapid strep A (Completed)   POCT Influenza A/B (Completed)     COVID, Flu, and Strep negative today Likely another viral infection.  Continue supportive measures including rest, hydration, humidifier use, steam showers, warm compresses to sinuses, warm liquids with lemon and honey, and over-the-counter cough, cold, and analgesics as needed.  If symptoms persist past 8-10 days, become more severe, or you feel better for a few days and then symptoms worsen again, please follow-up for additional treatments.     Meds ordered this encounter  Medications   benzonatate (TESSALON) 200 MG capsule    Sig: Take 1 capsule (200 mg total) by mouth 2 (two) times daily as needed for cough.    Dispense:  20 capsule    Refill:  0    Order Specific Question:   Supervising Provider    Answer:   Danise Edge A [[id number]]   guaiFENesin (MUCINEX) 600 MG 12 hr tablet    Sig: Take 2 tablets (1,200 mg total) by mouth 2 (two) times daily.    Dispense:  30 tablet    Refill:  0    Order Specific Question:   Supervising Provider    Answer:   Danise Edge A [[id number]]   fluticasone (FLONASE) 50 MCG/ACT nasal spray    Sig: Place 2 sprays into both nostrils daily.    Dispense:  16 g    Refill:  6    Order Specific Question:   Supervising Provider    Answer:   Danise Edge A [[id number]]    Return if symptoms worsen or fail to improve.  Clayborne Dana, NP

## 2023-01-19 ENCOUNTER — Other Ambulatory Visit (HOSPITAL_COMMUNITY)
Admission: RE | Admit: 2023-01-19 | Discharge: 2023-01-19 | Disposition: A | Discharge status: Home / Self Care | Payer: BC Managed Care – PPO | Source: Ambulatory Visit | Attending: Family Medicine | Admitting: Family Medicine

## 2023-01-19 ENCOUNTER — Ambulatory Visit (INDEPENDENT_AMBULATORY_CARE_PROVIDER_SITE_OTHER): Payer: BC Managed Care – PPO | Admitting: Family Medicine

## 2023-01-19 ENCOUNTER — Encounter: Payer: Self-pay | Admitting: Family Medicine

## 2023-01-19 VITALS — BP 129/85 | HR 76 | Ht 67.0 in | Wt 178.8 lb

## 2023-01-19 DIAGNOSIS — R5383 Other fatigue: Secondary | ICD-10-CM | POA: Diagnosis not present

## 2023-01-19 DIAGNOSIS — E162 Hypoglycemia, unspecified: Secondary | ICD-10-CM | POA: Diagnosis not present

## 2023-01-19 DIAGNOSIS — E782 Mixed hyperlipidemia: Secondary | ICD-10-CM | POA: Diagnosis not present

## 2023-01-19 DIAGNOSIS — B3731 Acute candidiasis of vulva and vagina: Secondary | ICD-10-CM

## 2023-01-19 DIAGNOSIS — F419 Anxiety disorder, unspecified: Secondary | ICD-10-CM

## 2023-01-19 DIAGNOSIS — Z113 Encounter for screening for infections with a predominantly sexual mode of transmission: Secondary | ICD-10-CM | POA: Diagnosis not present

## 2023-01-19 LAB — LIPID PANEL
Cholesterol: 228 mg/dL — ABNORMAL HIGH (ref 0–200)
HDL: 40.2 mg/dL (ref >39.00)
LDL Cholesterol: 169 mg/dL — ABNORMAL HIGH (ref 0–99)
NonHDL: 188.17
Total CHOL/HDL Ratio: 6
Triglycerides: 98 mg/dL (ref 0.0–149.0)
VLDL: 19.6 mg/dL (ref 0.0–40.0)

## 2023-01-19 LAB — CBC WITH DIFFERENTIAL/PLATELET
Basophils Absolute: 0.1 10*3/uL (ref 0.0–0.1)
Basophils Relative: 0.8 % (ref 0.0–3.0)
Eosinophils Absolute: 0.2 10*3/uL (ref 0.0–0.7)
Eosinophils Relative: 2.5 % (ref 0.0–5.0)
HCT: 40 % (ref 36.0–46.0)
Hemoglobin: 13 g/dL (ref 12.0–15.0)
Lymphocytes Relative: 31.9 % (ref 12.0–46.0)
Lymphs Abs: 2.4 10*3/uL (ref 0.7–4.0)
MCHC: 32.6 g/dL (ref 30.0–36.0)
MCV: 82.8 fL (ref 78.0–100.0)
Monocytes Absolute: 0.5 10*3/uL (ref 0.1–1.0)
Monocytes Relative: 6.5 % (ref 3.0–12.0)
Neutro Abs: 4.3 10*3/uL (ref 1.4–7.7)
Neutrophils Relative %: 58.3 % (ref 43.0–77.0)
Platelets: 165 10*3/uL (ref 150.0–400.0)
RBC: 4.83 Mil/uL (ref 3.87–5.11)
RDW: 14.6 % (ref 11.5–15.5)
WBC: 7.4 10*3/uL (ref 4.0–10.5)

## 2023-01-19 LAB — IBC + FERRITIN
Ferritin: 10.1 ng/mL (ref 10.0–291.0)
Iron: 115 ug/dL (ref 42–145)
Saturation Ratios: 32 % (ref 20.0–50.0)
TIBC: 359.8 ug/dL (ref 250.0–450.0)
Transferrin: 257 mg/dL (ref 212.0–360.0)

## 2023-01-19 LAB — COMPREHENSIVE METABOLIC PANEL
ALT: 13 U/L (ref 0–35)
AST: 20 U/L (ref 0–37)
Albumin: 4.6 g/dL (ref 3.5–5.2)
Alkaline Phosphatase: 79 U/L (ref 39–117)
BUN: 12 mg/dL (ref 6–23)
CO2: 26 meq/L (ref 19–32)
Calcium: 9.5 mg/dL (ref 8.4–10.5)
Chloride: 104 meq/L (ref 96–112)
Creatinine, Ser: 0.82 mg/dL (ref 0.40–1.20)
GFR: 92.89 mL/min (ref >60.00)
Glucose, Bld: 64 mg/dL — ABNORMAL LOW (ref 70–99)
Potassium: 4.2 meq/L (ref 3.5–5.1)
Sodium: 138 meq/L (ref 135–145)
Total Bilirubin: 0.8 mg/dL (ref 0.2–1.2)
Total Protein: 7.1 g/dL (ref 6.0–8.3)

## 2023-01-19 LAB — HEMOGLOBIN A1C: Hgb A1c MFr Bld: 5.8 % (ref 4.6–6.5)

## 2023-01-19 LAB — B12 AND FOLATE PANEL
Folate: 8.2 ng/mL (ref >5.9)
Vitamin B-12: 621 pg/mL (ref 211–911)

## 2023-01-19 LAB — TSH: TSH: 1.3 u[IU]/mL (ref 0.35–5.50)

## 2023-01-19 MED ORDER — SERTRALINE HCL 50 MG PO TABS
50.0000 mg | ORAL_TABLET | Freq: Every day | ORAL | 3 refills | Status: DC
Start: 2023-01-19 — End: 2023-02-12

## 2023-01-19 MED ORDER — HYDROXYZINE PAMOATE 25 MG PO CAPS
25.0000 mg | ORAL_CAPSULE | Freq: Three times a day (TID) | ORAL | 0 refills | Status: DC | PRN
Start: 2023-01-19 — End: 2023-02-12

## 2023-01-19 NOTE — Assessment & Plan Note (Signed)
Increased anxiety symptoms with physical manifestations including chest pain, shortness of breath, and muscle tension. Symptoms occur in the absence of identifiable triggers. Previous trial of Zoloft and hydroxyzine with good tolerance and symptom control. No SI/HI. -Resume Zoloft 50mg , starting with half a tablet for two weeks, then increase to a full tablet. -Resume hydroxyzine 25mg  as needed for acute anxiety symptoms. -Schedule follow-up in six weeks to assess response and consider dose adjustment.

## 2023-01-19 NOTE — Patient Instructions (Signed)
Taking the medicine as directed and not missing any doses is one of the best things you can do to treat your anxiety/depression.  Here are some things to keep in mind: Side effects (stomach upset, some increased anxiety) may happen before you notice a benefit.  These side effects typically go away over time. Changes to your dose of medicine or a change in medication all together is sometimes necessary Many people will notice an improvement within two weeks but the full effect of the medication can take up to 4-6 weeks Stopping the medication when you start feeling better often results in a return of symptoms. Most people need to be on medication at least 6-12 months If you start having thoughts of hurting yourself or others after starting this medicine, please call me immediately.    

## 2023-01-19 NOTE — Assessment & Plan Note (Signed)
Previous high cholesterol. -Check lipid panel as part of comprehensive lab work.

## 2023-01-19 NOTE — Progress Notes (Signed)
Established Patient Office Visit  Subjective   Patient ID: Julie [name], female    DOB: 1985-02-12  Age: 38 y.o. MRN: [id number]  Chief Complaint  Patient presents with   Medical Management of Chronic Issues    HPI   Discussed the use of AI scribe software for clinical note transcription with the patient, who gave verbal consent to proceed.  History of Present Illness   The patient presents with a chief complaint of persistent anxiety, characterized by a sensation of shaking, chest tightness, and difficulty breathing. They describe these symptoms as occurring spontaneously, even in normal settings, and report feeling constantly on edge. The patient denies any identifiable triggers or recent life changes that could be contributing to this heightened state of anxiety.  Despite improvements in sleep duration, the patient reports a persistent feeling of being overstimulated and a lack of energy. They describe episodes of emotional instability, including random bouts of crying, and physical symptoms such as chest pain and arm discomfort during periods of intense anxiety. The patient denies any cardiac symptoms during regular physical activity, such as climbing stairs, suggesting these symptoms are anxiety-related.  The patient has a history of treatment with Lexapro, Zoloft and hydroxyzine, which they stopped taking in October of the previous year. She is interested in restarting the Zoloft and hydroxyzine.  The patient also reports a loss of appetite, forcing themselves to eat one meal a day and snack on nuts in between. They deny any recent changes in their diet or lifestyle that could explain this symptom. The patient requests a full panel of blood work and STD testing for their own peace of mind, despite having a stable partner. They also express concern about their cholesterol levels, which were reported to be high during their last physical.           01/19/2023    9:44 AM  06/28/2022    9:04 AM 08/19/2021    8:21 AM  PHQ9 SCORE ONLY  PHQ-9 Total Score 13 14 0      01/19/2023    9:45 AM 06/28/2022    9:05 AM 08/19/2021    8:21 AM 08/02/2021    4:29 PM  GAD 7 : Generalized Anxiety Score  Nervous, Anxious, on Edge 3 1 1 3   Control/stop worrying 2 1 0 1  Worry too much - different things 1 1 0 2  Trouble relaxing 1 1 0 3  Restless 1 1 0 2  Easily annoyed or irritable 1 2 0 3  Afraid - awful might happen 2 1 0 3  Total GAD 7 Score 11 8 1 17   Anxiety Difficulty Very difficult Somewhat difficult Somewhat difficult Very difficult       ROS All review of systems negative except what is listed in the HPI    Objective:     BP 129/85   Pulse 76   Ht 5\' 7"  (1.702 m)   Wt 178 lb 12.8 oz (81.1 kg)   SpO2 95%   BMI 28.00 kg/m    Physical Exam Vitals reviewed.  Constitutional:      Appearance: Normal appearance.  Cardiovascular:     Rate and Rhythm: Normal rate and regular rhythm.  Pulmonary:     Effort: Pulmonary effort is normal.     Breath sounds: Normal breath sounds.  Skin:    General: Skin is warm and dry.  Neurological:     Mental Status: She is alert and oriented to person, place, and time.  Psychiatric:        Mood and Affect: Mood normal.        Behavior: Behavior normal.        Thought Content: Thought content normal.        Judgment: Judgment normal.      Results for orders placed or performed in visit on 01/19/23  HgB A1c  Result Value Ref Range   Hgb A1c MFr Bld 5.8 4.6 - 6.5 %  CBC with Differential/Platelet  Result Value Ref Range   WBC 7.4 4.0 - 10.5 K/uL   RBC 4.83 3.87 - 5.11 Mil/uL   Hemoglobin 13.0 12.0 - 15.0 g/dL   HCT 40.9 81.1 - 91.4 %   MCV 82.8 78.0 - 100.0 fl   MCHC 32.6 30.0 - 36.0 g/dL   RDW 78.2 95.6 - 21.3 %   Platelets 165.0 150.0 - 400.0 K/uL   Neutrophils Relative % 58.3 43.0 - 77.0 %   Lymphocytes Relative 31.9 12.0 - 46.0 %   Monocytes Relative 6.5 3.0 - 12.0 %   Eosinophils Relative 2.5 0.0  - 5.0 %   Basophils Relative 0.8 0.0 - 3.0 %   Neutro Abs 4.3 1.4 - 7.7 K/uL   Lymphs Abs 2.4 0.7 - 4.0 K/uL   Monocytes Absolute 0.5 0.1 - 1.0 K/uL   Eosinophils Absolute 0.2 0.0 - 0.7 K/uL   Basophils Absolute 0.1 0.0 - 0.1 K/uL  Comprehensive metabolic panel  Result Value Ref Range   Sodium 138 135 - 145 mEq/L   Potassium 4.2 3.5 - 5.1 mEq/L   Chloride 104 96 - 112 mEq/L   CO2 26 19 - 32 mEq/L   Glucose, Bld 64 (L) 70 - 99 mg/dL   BUN 12 6 - 23 mg/dL   Creatinine, Ser 0.86 0.40 - 1.20 mg/dL   Total Bilirubin 0.8 0.2 - 1.2 mg/dL   Alkaline Phosphatase 79 39 - 117 U/L   AST 20 0 - 37 U/L   ALT 13 0 - 35 U/L   Total Protein 7.1 6.0 - 8.3 g/dL   Albumin 4.6 3.5 - 5.2 g/dL   GFR 57.84 >69.62 mL/min   Calcium 9.5 8.4 - 10.5 mg/dL  Lipid panel  Result Value Ref Range   Cholesterol 228 (H) 0 - 200 mg/dL   Triglycerides 95.2 0.0 - 149.0 mg/dL   HDL 84.13 >24.40 mg/dL   VLDL 10.2 0.0 - 72.5 mg/dL   LDL Cholesterol 366 (H) 0 - 99 mg/dL   Total CHOL/HDL Ratio 6    NonHDL 188.17   TSH  Result Value Ref Range   TSH 1.30 0.35 - 5.50 uIU/mL  B12 and Folate Panel  Result Value Ref Range   Vitamin B-12 621 211 - 911 pg/mL   Folate 8.2 >5.9 ng/mL  IBC + Ferritin  Result Value Ref Range   Iron 115 42 - 145 ug/dL   Transferrin 440.3 474.2 - 360.0 mg/dL   Saturation Ratios 59.5 20.0 - 50.0 %   Ferritin 10.1 10.0 - 291.0 ng/mL   TIBC 359.8 250.0 - 450.0 mcg/dL      The ASCVD Risk score (Arnett DK, et al., 2019) failed to calculate for the following reasons:   The 2019 ASCVD risk score is only valid for ages 69 to 25    Assessment & Plan:   Problem List Items Addressed This Visit       Active Problems   Anxiety - Primary   Increased anxiety symptoms with physical manifestations including  chest pain, shortness of breath, and muscle tension. Symptoms occur in the absence of identifiable triggers. Previous trial of Zoloft and hydroxyzine with good tolerance and symptom control.  No SI/HI. -Resume Zoloft 50mg , starting with half a tablet for two weeks, then increase to a full tablet. -Resume hydroxyzine 25mg  as needed for acute anxiety symptoms. -Schedule follow-up in six weeks to assess response and consider dose adjustment.      Relevant Medications   hydrOXYzine (VISTARIL) 25 MG capsule   sertraline (ZOLOFT) 50 MG tablet   Other Relevant Orders   TSH (Completed)   Mixed hyperlipidemia   Previous high cholesterol. -Check lipid panel as part of comprehensive lab work.       Relevant Orders   Lipid panel (Completed)   Other Visit Diagnoses       Hypoglycemia       Relevant Orders   HgB A1c (Completed)   Comprehensive metabolic panel (Completed)     Fatigue, unspecified type     Labs today   Relevant Orders   CBC with Differential/Platelet (Completed)   TSH (Completed)   B12 and Folate Panel (Completed)   IBC + Ferritin (Completed)     Screen for STD (sexually transmitted disease)     Asymptomatic. Routine screening requested.    Relevant Orders   HIV Antibody (routine testing w rflx)   RPR   Cervicovaginal ancillary only       Return in about 6 weeks (around 03/02/2023) for mood f/u.    Clayborne Dana, NP

## 2023-01-20 LAB — HIV ANTIBODY (ROUTINE TESTING W REFLEX): HIV 1&2 Ab, 4th Generation: NONREACTIVE

## 2023-01-20 LAB — RPR: RPR Ser Ql: NONREACTIVE

## 2023-01-22 ENCOUNTER — Encounter: Payer: Self-pay | Admitting: Family Medicine

## 2023-01-22 LAB — CERVICOVAGINAL ANCILLARY ONLY
Bacterial Vaginitis (gardnerella): NEGATIVE
Candida Glabrata: NEGATIVE
Candida Vaginitis: POSITIVE — AB
Chlamydia: NEGATIVE
Comment: NEGATIVE
Comment: NEGATIVE
Comment: NEGATIVE
Comment: NEGATIVE
Comment: NEGATIVE
Comment: NORMAL
Neisseria Gonorrhea: NEGATIVE
Trichomonas: NEGATIVE

## 2023-01-22 MED ORDER — FLUCONAZOLE 150 MG PO TABS: 150.0000 mg | ORAL_TABLET | Freq: Every day | ORAL | 0 refills | Status: DC

## 2023-01-22 NOTE — Addendum Note (Signed)
Addended by: Clayborne Dana on: 01/22/2023 04:36 PM   Modules accepted: Orders

## 2023-02-02 DIAGNOSIS — R92323 Mammographic fibroglandular density, bilateral breasts: Secondary | ICD-10-CM | POA: Diagnosis not present

## 2023-02-02 DIAGNOSIS — Z1231 Encounter for screening mammogram for malignant neoplasm of breast: Secondary | ICD-10-CM | POA: Diagnosis not present

## 2023-02-02 LAB — HM MAMMOGRAPHY

## 2023-02-05 ENCOUNTER — Encounter: Payer: Self-pay | Admitting: Family Medicine

## 2023-02-10 ENCOUNTER — Other Ambulatory Visit: Payer: Self-pay | Admitting: Family Medicine

## 2023-02-10 DIAGNOSIS — F419 Anxiety disorder, unspecified: Secondary | ICD-10-CM

## 2023-03-02 ENCOUNTER — Telehealth: Payer: BC Managed Care – PPO | Admitting: Family Medicine

## 2023-03-02 ENCOUNTER — Encounter: Payer: Self-pay | Admitting: Family Medicine

## 2023-03-02 DIAGNOSIS — F419 Anxiety disorder, unspecified: Secondary | ICD-10-CM | POA: Diagnosis not present

## 2023-03-02 NOTE — Assessment & Plan Note (Signed)
 PHQ9/GAD7 discussed. Improved mood with Zoloft 50mg  and Hydroxyzine as needed. No reported side effects. No suicidal ideation. She would like to try increasing to 1.5 tabs (75 mg daily). States she has enough supply right now.  Notify me in about a month if this is working and we can continue at current dose or considering increasing again.

## 2023-03-02 NOTE — Progress Notes (Signed)
 Virtual Video Visit via MyChart Note  I connected with  Julie [name] on 03/02/23 at  9:40 AM EST by the video enabled telemedicine application for MyChart, and verified that I am speaking with the correct person using two identifiers.   I introduced myself as a Publishing rights manager with the practice. We discussed the limitations of evaluation and management by telemedicine and the availability of in person appointments. The patient expressed understanding and agreed to proceed.  Participating parties in this visit include: The patient and the nurse practitioner listed.  The patient is: At home I am: In the office - [facility]  Subjective:    CC:  Chief Complaint  Patient presents with   Anxiety    HPI: Julie [name] is a 38 y.o. year old female presenting today via MyChart today for mood follow-up.   Mood follow-up: - Diagnosis: anxiety - Treatment: Zoloft 50 mg daily, PRN hydroxyzine - Medication side effects: none - SI/HI: no - Update: She has noticed some progress and would like to gradually increase dose.       03/02/2023    9:19 AM 01/19/2023    9:44 AM 06/28/2022    9:04 AM  PHQ9 SCORE ONLY  PHQ-9 Total Score 10 13 14       03/02/2023    9:21 AM 01/19/2023    9:45 AM 06/28/2022    9:05 AM 08/19/2021    8:21 AM  GAD 7 : Generalized Anxiety Score  Nervous, Anxious, on Edge 1 3 1 1   Control/stop worrying 1 2 1  0  Worry too much - different things 1 1 1  0  Trouble relaxing 3 1 1  0  Restless 1 1 1  0  Easily annoyed or irritable 1 1 2  0  Afraid - awful might happen 0 2 1 0  Total GAD 7 Score 8 11 8 1   Anxiety Difficulty Very difficult Very difficult Somewhat difficult Somewhat difficult         Past medical history, Surgical history, Family history not pertinant except as noted below, Social history, Allergies, and medications have been entered into the medical record, reviewed, and corrections made.   Review of Systems:   All review of systems negative except what is listed in the HPI   Objective:    General:  Speaking clearly in complete sentences. Absent shortness of breath noted.   Alert and oriented x3.   Normal judgment.  Absent acute distress.   Impression and Recommendations:    Problem List Items Addressed This Visit       Active Problems   Anxiety   PHQ9/GAD7 discussed. Improved mood with Zoloft 50mg  and Hydroxyzine as needed. No reported side effects. No suicidal ideation. She would like to try increasing to 1.5 tabs (75 mg daily). States she has enough supply right now.  Notify me in about a month if this is working and we can continue at current dose or considering increasing again.         Follow-up if symptoms worsen or fail to improve.    I discussed the assessment and treatment plan with the patient. The patient was provided an opportunity to ask questions and all were answered. The patient agreed with the plan and demonstrated an understanding of the instructions.   The patient was advised to call back or seek an in-person evaluation if the symptoms worsen or if the condition fails to improve as anticipated.   Clayborne Dana, NP

## 2023-03-02 NOTE — Progress Notes (Signed)
 Patient states better with medication.   Scores have increased.  PHQ today 13, prior 10 GAD today 11, prior 8

## 2023-05-18 ENCOUNTER — Other Ambulatory Visit: Payer: Self-pay | Admitting: Family Medicine

## 2023-05-18 DIAGNOSIS — F419 Anxiety disorder, unspecified: Secondary | ICD-10-CM

## 2023-05-22 ENCOUNTER — Other Ambulatory Visit: Payer: Self-pay | Admitting: Family Medicine

## 2023-05-22 DIAGNOSIS — F419 Anxiety disorder, unspecified: Secondary | ICD-10-CM

## 2023-06-06 ENCOUNTER — Telehealth: Admitting: Family Medicine

## 2023-06-06 DIAGNOSIS — J3089 Other allergic rhinitis: Secondary | ICD-10-CM | POA: Diagnosis not present

## 2023-06-06 DIAGNOSIS — R051 Acute cough: Secondary | ICD-10-CM

## 2023-06-06 MED ORDER — PREDNISONE 20 MG PO TABS
20.0000 mg | ORAL_TABLET | Freq: Every day | ORAL | 0 refills | Status: DC
Start: 2023-06-06 — End: 2023-06-11

## 2023-06-06 NOTE — Progress Notes (Signed)
 Virtual Visit Consent   Julie [name], you are scheduled for a virtual visit with a [facility] provider today. Just as with appointments in the office, your consent must be obtained to participate. Your consent will be active for this visit and any virtual visit you may have with one of our providers in the next 365 days. If you have a MyChart account, a copy of this consent can be sent to you electronically.  As this is a virtual visit, video technology does not allow for your provider to perform a traditional examination. This may limit your provider's ability to fully assess your condition. If your provider identifies any concerns that need to be evaluated in person or the need to arrange testing (such as labs, EKG, etc.), we will make arrangements to do so. Although advances in technology are sophisticated, we cannot ensure that it will always work on either your end or our end. If the connection with a video visit is poor, the visit may have to be switched to a telephone visit. With either a video or telephone visit, we are not always able to ensure that we have a secure connection.  By engaging in this virtual visit, you consent to the provision of healthcare and authorize for your insurance to be billed (if applicable) for the services provided during this visit. Depending on your insurance coverage, you may receive a charge related to this service.  I need to obtain your verbal consent now. Are you willing to proceed with your visit today? Julie [name] has provided verbal consent on 06/06/2023 for a virtual visit (video or telephone). Lanetta Pion, NP  Date: 06/06/2023 10:02 AM   Virtual Visit via Video Note   I, Lanetta Pion, connected with  Julie [name]  ([id number], 31-Mar-1985) on 06/06/23 at 10:00 AM EDT by a video-enabled telemedicine application and verified that I am speaking with the correct person using two identifiers.  Location: Patient: Virtual Visit Location  Patient: Home Provider: Virtual Visit Location Provider: Home Office   I discussed the limitations of evaluation and management by telemedicine and the availability of in person appointments. The patient expressed understanding and agreed to proceed.    History of Present Illness: Julie [name] is a 38 y.o. who identifies as a female who was assigned female at birth, and is being seen today for allergy   Onset was 3 days ago  Hoarseness, nasal and ear drainage, sore throat, feverish yesterday.  Allegra daily, tried to use nasal spray- developed nose bleeds from it Has had migraine on going (that is not usually for her) on medication for it.  Has not been outside a lot lately, not daily, but just to stay out - mins a few times a week,  Denies being around anyone sick. No recent travel.   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 Appt for further refills, Disp: 90 capsule, Rfl: 0   sertraline  (ZOLOFT ) 50 MG tablet, Take 1.5 tablets (75 mg total) by mouth daily., Disp: 135 tablet, Rfl: 0   SUMAtriptan  (IMITREX ) 100 MG  tablet, Take 1 tablet (100 mg total) by mouth every 2 (two) hours as needed for migraine. May repeat in 2 hours if headache persists or recurs. Max dose 2 pills in 24 hours, Disp: 10 tablet, Rfl: 6  Observations/Objective: Patient is well-developed, well-nourished in no acute distress.  Resting comfortably  at home.  Head is normocephalic, atraumatic.  No labored breathing.  Speech is clear and coherent with logical content.  Patient is alert and oriented at baseline.    Assessment and Plan:   1. Acute cough (Primary)  - predniSONE  (DELTASONE ) 20 MG tablet; Take 1 tablet (20 mg total) by mouth daily with breakfast for 5 days.  Dispense: 5 tablet; Refill: 0  2. Environmental and seasonal allergies  - predniSONE  (DELTASONE ) 20 MG tablet; Take 1 tablet (20 mg total) by mouth daily with breakfast for 5 days.  Dispense: 5 tablet; Refill: 0   -swap allegra for xyzal and use prednisone  to help get voice back  - Take meds as prescribed - Rest voice - Use a cool mist humidifier as needed. - Use saline nose sprays frequently to help soothe nasal passages if they are drying out. - Stay hydrated by drinking plenty of fluids  If you do not improve you will need a follow up visit in person.               Reviewed side effects, risks and benefits of medication.    Patient acknowledged agreement and understanding of the plan.   Past Medical, Surgical, Social History, Allergies, and Medications have been Reviewed.    Follow Up Instructions: I discussed the assessment and treatment plan with the patient. The patient was provided an opportunity to ask questions and all were answered. The patient agreed with the plan and demonstrated an understanding of the instructions.  A copy of instructions were sent to the patient via MyChart unless otherwise noted below.    The patient was advised to call back or seek an in-person evaluation if the symptoms worsen or if the condition fails to improve  as anticipated.    Lanetta Pion, NP

## 2023-06-06 NOTE — Patient Instructions (Addendum)
  Aubryanna Nona Bayard, thank you for joining Lanetta Pion, NP for today's virtual visit.  While this provider is not your primary care provider (PCP), if your PCP is located in our provider database this encounter information will be shared with them immediately following your visit.   A [facility] MyChart account gives you access to today's visit and all your visits, tests, and labs performed at [facility] " click here if you don't have a [facility] MyChart account or go to [email or web address]  Consent: (Patient) Kerrilyn A Palinkas provided verbal consent for this virtual visit at the beginning of the encounter.  Current Medications:  Current Outpatient Medications:    predniSONE  (DELTASONE ) 20 MG tablet, Take 1 tablet (20 mg total) by mouth daily with breakfast for 5 days., Disp: 5 tablet, Rfl: 0   hydrOXYzine  (VISTARIL ) 25 MG capsule, Take 1 capsule (25 mg total) by mouth every 8 (eight) hours as needed. Appt for further refills, Disp: 90 capsule, Rfl: 0   sertraline  (ZOLOFT ) 50 MG tablet, Take 1.5 tablets (75 mg total) by mouth daily., Disp: 135 tablet, Rfl: 0   SUMAtriptan  (IMITREX ) 100 MG tablet, Take 1 tablet (100 mg total) by mouth every 2 (two) hours as needed for migraine. May repeat in 2 hours if headache persists or recurs. Max dose 2 pills in 24 hours, Disp: 10 tablet, Rfl: 6   Medications ordered in this encounter:  Meds ordered this encounter  Medications   predniSONE  (DELTASONE ) 20 MG tablet    Sig: Take 1 tablet (20 mg total) by mouth daily with breakfast for 5 days.    Dispense:  5 tablet    Refill:  0    Supervising Provider:   Corine Dice [[id number]]     *If you need refills on other medications prior to your next appointment, please contact your pharmacy*  Follow-Up: Call back or seek an in-person evaluation if the symptoms worsen or if the condition fails to improve as anticipated.  [facility] [phone]  Other  Instructions - Take meds as prescribed - Rest voice - Use a cool mist humidifier as needed. - Use saline nose sprays frequently to help soothe nasal passages if they are drying out. - Stay hydrated by drinking plenty of fluids  If you do not improve you will need a follow up visit in person.                 If you have been instructed to have an in-person evaluation today at a local Urgent Care facility, please use the link below. It will take you to a list of all of our available [facility], including address, phone number and hours of operation. Please do not delay care.  [facility]  If you or a family member do not have a primary care provider, use the link below to schedule a visit and establish care. When you choose a [facility] primary care physician or advanced practice provider, you gain a long-term partner in health. Find a Primary Care Provider  Learn more about [facility] in-office and virtual care options: [facility] - Get Care Now

## 2023-06-11 ENCOUNTER — Encounter: Payer: Self-pay | Admitting: Family Medicine

## 2023-06-11 ENCOUNTER — Ambulatory Visit (INDEPENDENT_AMBULATORY_CARE_PROVIDER_SITE_OTHER): Admitting: Family Medicine

## 2023-06-11 VITALS — BP 120/81 | HR 74 | Temp 98.1°F | Ht 67.0 in | Wt 184.0 lb

## 2023-06-11 DIAGNOSIS — J309 Allergic rhinitis, unspecified: Secondary | ICD-10-CM

## 2023-06-11 DIAGNOSIS — J040 Acute laryngitis: Secondary | ICD-10-CM | POA: Diagnosis not present

## 2023-06-11 DIAGNOSIS — G43009 Migraine without aura, not intractable, without status migrainosus: Secondary | ICD-10-CM

## 2023-06-11 MED ORDER — KETOROLAC TROMETHAMINE 30 MG/ML IJ SOLN
30.0000 mg | Freq: Once | INTRAMUSCULAR | Status: AC
  Administered 2023-06-11: 30 mg via INTRAMUSCULAR

## 2023-06-11 MED ORDER — EMGALITY 120 MG/ML ~~LOC~~ SOAJ: 240.0000 mg | Freq: Once | SUBCUTANEOUS | 0 refills | Status: AC

## 2023-06-11 MED ORDER — EMGALITY 120 MG/ML ~~LOC~~ SOAJ: 120.0000 mg | SUBCUTANEOUS | 6 refills | Status: AC

## 2023-06-11 NOTE — Progress Notes (Signed)
 Acute Office Visit  Subjective:     Patient ID: Julie [name], female    DOB: 23-Jan-1985, 38 y.o.   MRN: [id number]  Chief Complaint  Patient presents with   Medical Management of Chronic Issues    HPI Patient is in today for laryngitis, migraine.   Discussed the use of AI scribe software for clinical note transcription with the patient, who gave verbal consent to proceed.  History of Present Illness Julie [name] is a 38 year old female with migraines and allergies who presents with persistent migraine and throat pain, laryngitis.  She has been experiencing a severe and unrelenting migraine for the past five to six days, starting on Thursday, June 5th. She has been using Imitrex  and Excedrin, along with sleep, to manage the pain. She also mentions trying to stay hydrated with electrolytes and water. Despite these efforts, the migraine persists, and she has stopped taking her sleeping medication to focus on managing the migraine. She has previously been prescribed Emgality  for her migraines by a neurologist, but has not used it since 2023. She mentions that she has tried various triptans without success. She has also recently completed a five-day course of prednisone  at 20 mg, which did not alleviate her symptoms.  In addition to the migraine, she reports throat pain that is exacerbated by hot foods and drinks, finding relief only with cold items like ice cream or slushies. She has experienced a few episodes of possible fever since the onset of her symptoms, with the fever breaking shortly after it starts. She also reports a lack of appetite, with coffee being the only thing she can consume. No consistent fever, significant swelling of the throat, or nausea requiring medication. She denies any nausea medication use, stating that she would likely vomit if she took it. She has a history of allergies, which she believes are contributing to her current symptoms. She experiences drainage  in her ears and a burning sensation in her throat. She has been using allergy medications, including a nasal spray, but stopped the nasal spray due to epistaxis. She notes that being outside is a significant trigger for her symptoms, and she takes precautions such as wearing a mask and washing thoroughly after being outdoors.      ROS All review of systems negative except what is listed in the HPI      Objective:    BP 120/81   Pulse 74   Temp 98.1 F (36.7 C) (Oral)   Ht 5\' 7"  (1.702 m)   Wt 184 lb (83.5 kg)   SpO2 100%   BMI 28.82 kg/m    Physical Exam Vitals reviewed.  Constitutional:      Appearance: Normal appearance.  HENT:     Head: Normocephalic and atraumatic.     Right Ear: Tympanic membrane normal.     Left Ear: Tympanic membrane normal.     Nose: No congestion.     Mouth/Throat:     Pharynx: Postnasal drip present. No oropharyngeal exudate or posterior oropharyngeal erythema.     Tonsils: No tonsillar exudate or tonsillar abscesses. 0 on the right. 0 on the left.  Neck:     Vascular: No carotid bruit.  Cardiovascular:     Rate and Rhythm: Normal rate and regular rhythm.  Pulmonary:     Effort: Pulmonary effort is normal.     Breath sounds: No wheezing, rhonchi or rales.  Musculoskeletal:     Cervical back: Normal range of motion. No  rigidity or tenderness.  Lymphadenopathy:     Cervical: No cervical adenopathy.  Neurological:     Mental Status: She is alert and oriented to person, place, and time.  Psychiatric:        Mood and Affect: Mood normal.        Behavior: Behavior normal.        Thought Content: Thought content normal.        Judgment: Judgment normal.         No results found for any visits on 06/11/23.      Assessment & Plan:   Problem List Items Addressed This Visit       Active Problems   Migraine without aura - Primary   Relevant Medications   Galcanezumab -gnlm (EMGALITY ) 120 MG/ML SOAJ   Galcanezumab -gnlm (EMGALITY )  120 MG/ML SOAJ (Start on 07/11/2023)   Other Visit Diagnoses       Laryngitis, acute         Allergic rhinitis, unspecified seasonality, unspecified trigger           Assessment & Plan Migraine Persistent migraine unresponsive to Imitrex  and Excedrin, severe intensity with nausea. Emgality  previously effective but not used since 2023.  - Administer Toradol  injection for acute migraine relief. - Order Emgality . Consider neurology referral if denied. - Encourage hydration and rest.  Allergic Rhinitis with Pharyngitis Symptoms include throat burning, post-nasal drip, and intermittent fever, likely viral/allergic. Discontinued Flonase  due to epistaxis.  - Recommend chloraseptic throat spray for symptomatic relief. - Encourage use of Astelin nasal spray to reduce post-nasal drip. - Advise on hydration and resting the voice. - Monitor for bacterial infection signs, consider antibiotics if needed.         Meds ordered this encounter  Medications   Galcanezumab -gnlm (EMGALITY ) 120 MG/ML SOAJ    Sig: Inject 240 mg into the skin once for 1 dose. Month #1, loading dose.    Dispense:  2 mL    Refill:  0    Supervising Provider:   Randie Bustle A [[id number]]   Galcanezumab -gnlm (EMGALITY ) 120 MG/ML SOAJ    Sig: Inject 120 mg into the skin every 30 (thirty) days. Maintenance dose (start 1 month after loading dose).    Dispense:  1 mL    Refill:  6    Supervising Provider:   Randie Bustle A [[id number]]   ketorolac  (TORADOL ) 30 MG/ML injection 30 mg    Return if symptoms worsen or fail to improve.  Everlina Hock, NP

## 2023-06-25 ENCOUNTER — Other Ambulatory Visit (HOSPITAL_COMMUNITY): Payer: Self-pay

## 2023-06-25 ENCOUNTER — Telehealth: Payer: Self-pay

## 2023-06-25 NOTE — Telephone Encounter (Signed)
 Pharmacy Patient Advocate Encounter   Received notification from CoverMyMeds that prior authorization for Emgality  120MG /ML auto-injectors (migraine) is required/requested.   Insurance verification completed.   The patient is insured through [employer] .   Per test claim: PA required and submitted KEY/EOC/Request #: [id number] from 05/26/23 to 06/24/24. Ran test claim, Copay is $35. This test claim was processed through [facility]- copay amounts may vary at other pharmacies due to pharmacy/plan contracts, or as the patient moves through the different stages of their insurance plan.

## 2023-06-29 ENCOUNTER — Ambulatory Visit (INDEPENDENT_AMBULATORY_CARE_PROVIDER_SITE_OTHER): Admitting: Family Medicine

## 2023-06-29 ENCOUNTER — Other Ambulatory Visit (HOSPITAL_COMMUNITY)
Admission: RE | Admit: 2023-06-29 | Discharge: 2023-06-29 | Disposition: A | Discharge status: Home / Self Care | Source: Ambulatory Visit | Attending: Family Medicine | Admitting: Family Medicine

## 2023-06-29 VITALS — BP 123/75 | HR 85 | Ht 67.0 in | Wt 181.0 lb

## 2023-06-29 DIAGNOSIS — E782 Mixed hyperlipidemia: Secondary | ICD-10-CM

## 2023-06-29 DIAGNOSIS — R7303 Prediabetes: Secondary | ICD-10-CM

## 2023-06-29 DIAGNOSIS — Z113 Encounter for screening for infections with a predominantly sexual mode of transmission: Secondary | ICD-10-CM

## 2023-06-29 DIAGNOSIS — G43009 Migraine without aura, not intractable, without status migrainosus: Secondary | ICD-10-CM

## 2023-06-29 DIAGNOSIS — Z0000 Encounter for general adult medical examination without abnormal findings: Secondary | ICD-10-CM | POA: Diagnosis not present

## 2023-06-29 DIAGNOSIS — F419 Anxiety disorder, unspecified: Secondary | ICD-10-CM

## 2023-06-29 LAB — CBC WITH DIFFERENTIAL/PLATELET
Basophils Absolute: 0 10*3/uL (ref 0.0–0.1)
Basophils Relative: 0.7 % (ref 0.0–3.0)
Eosinophils Absolute: 0.1 10*3/uL (ref 0.0–0.7)
Eosinophils Relative: 2.2 % (ref 0.0–5.0)
HCT: 37.3 % (ref 36.0–46.0)
Hemoglobin: 12.2 g/dL (ref 12.0–15.0)
Lymphocytes Relative: 35.1 % (ref 12.0–46.0)
Lymphs Abs: 2.1 10*3/uL (ref 0.7–4.0)
MCHC: 32.6 g/dL (ref 30.0–36.0)
MCV: 81.1 fl (ref 78.0–100.0)
Monocytes Absolute: 0.3 10*3/uL (ref 0.1–1.0)
Monocytes Relative: 5.5 % (ref 3.0–12.0)
Neutro Abs: 3.4 10*3/uL (ref 1.4–7.7)
Neutrophils Relative %: 56.5 % (ref 43.0–77.0)
Platelets: 189 10*3/uL (ref 150.0–400.0)
RBC: 4.59 Mil/uL (ref 3.87–5.11)
RDW: 14.7 % (ref 11.5–15.5)
WBC: 6 10*3/uL (ref 4.0–10.5)

## 2023-06-29 LAB — COMPREHENSIVE METABOLIC PANEL WITH GFR
ALT: 12 U/L (ref 0–35)
AST: 17 U/L (ref 0–37)
Albumin: 4.6 g/dL (ref 3.5–5.2)
Alkaline Phosphatase: 65 U/L (ref 39–117)
BUN: 13 mg/dL (ref 6–23)
CO2: 31 meq/L (ref 19–32)
Calcium: 9.6 mg/dL (ref 8.4–10.5)
Chloride: 101 meq/L (ref 96–112)
Creatinine, Ser: 0.76 mg/dL (ref 0.40–1.20)
GFR: 101.44 mL/min (ref >60.00)
Glucose, Bld: 80 mg/dL (ref 70–99)
Potassium: 3.8 meq/L (ref 3.5–5.1)
Sodium: 135 meq/L (ref 135–145)
Total Bilirubin: 0.5 mg/dL (ref 0.2–1.2)
Total Protein: 6.9 g/dL (ref 6.0–8.3)

## 2023-06-29 LAB — LIPID PANEL
Cholesterol: 249 mg/dL — ABNORMAL HIGH (ref 0–200)
HDL: 49 mg/dL (ref >39.00)
LDL Cholesterol: 183 mg/dL — ABNORMAL HIGH (ref 0–99)
NonHDL: 200.1
Total CHOL/HDL Ratio: 5
Triglycerides: 88 mg/dL (ref 0.0–149.0)
VLDL: 17.6 mg/dL (ref 0.0–40.0)

## 2023-06-29 LAB — TSH: TSH: 1.01 u[IU]/mL (ref 0.35–5.50)

## 2023-06-29 LAB — HEMOGLOBIN A1C: Hgb A1c MFr Bld: 5.8 % (ref 4.6–6.5)

## 2023-06-29 NOTE — Assessment & Plan Note (Signed)
 Planning to start Emgality  soon - not yet filled.

## 2023-06-29 NOTE — Assessment & Plan Note (Signed)
 Previous high cholesterol. -Check lipid panel as part of comprehensive lab work.

## 2023-06-29 NOTE — Progress Notes (Signed)
 Complete physical exam  Patient: Julie [name]   DOB: January 06, 1985   37 y.o. Female  MRN: [id number]  Subjective:    Chief Complaint  Patient presents with   Annual Exam    Julie [name] is a 38 y.o. female who presents today for a complete physical exam. She reports consuming a general diet. Home exercise routine includes walking. She generally feels well. She reports sleeping poorly. She does have additional problems to discuss today.   Currently lives with: daughter and mom Acute concerns or interim problems since last visit:   Vision concerns: no concerns, sees eye doctor regularly Dental concerns: no concerns, sees dentist regularly STD concerns: no concerns, but would like routine screening today   Patient endorses ETOH use.- none recently, rare Patient endorses nicotine use. - 3-4 cig./day Patient endorses illegal substance use. - none   Females:  She is currently  sexually active with female She denies  concerns today about STI Contraception choices are: none LMP: 06/16/23      Most recent fall risk assessment:    06/29/2023   10:39 AM  Fall Risk   Falls in the past year? 0  Number falls in past yr: 0  Injury with Fall? 0  Risk for fall due to : No Fall Risks  Follow up Falls evaluation completed     Most recent depression screenings:    06/29/2023   10:40 AM 03/02/2023    9:19 AM  PHQ 2/9 Scores  PHQ - 2 Score 1 1  PHQ- 9 Score 11 10       06/29/2023   10:40 AM 03/02/2023    9:21 AM 01/19/2023    9:45 AM 06/28/2022    9:05 AM  GAD 7 : Generalized Anxiety Score  Nervous, Anxious, on Edge 1 1 3 1   Control/stop worrying 1 1 2 1   Worry too much - different things 1 1 1 1   Trouble relaxing 1 3 1 1   Restless 1 1 1 1   Easily annoyed or irritable 1 1 1 2   Afraid - awful might happen 0 0 2 1  Total GAD 7 Score 6 8 11 8   Anxiety Difficulty Somewhat difficult Very difficult Very difficult Somewhat difficult   - She feels that if she can get her  sleep straightened out then she will be okay. Not interested in anxiety/depression management right now.          Patient Care Team: Almarie Waddell NOVAK, NP as PCP - General (Family Medicine)   Outpatient Medications Prior to Visit  Medication Sig   [START ON 07/11/2023] Galcanezumab -gnlm (EMGALITY ) 120 MG/ML SOAJ Inject 120 mg into the skin every 30 (thirty) days. Maintenance dose (start 1 month after loading dose).   hydrOXYzine  (VISTARIL ) 25 MG capsule Take 1 capsule (25 mg total) by mouth every 8 (eight) hours as needed. Appt for further refills   sertraline  (ZOLOFT ) 50 MG tablet Take 1.5 tablets (75 mg total) by mouth daily.   SUMAtriptan  (IMITREX ) 100 MG tablet Take 1 tablet (100 mg total) by mouth every 2 (two) hours as needed for migraine. May repeat in 2 hours if headache persists or recurs. Max dose 2 pills in 24 hours   No facility-administered medications prior to visit.    ROS All review of systems negative except what is listed in the HPI        Objective:     BP 123/75   Pulse 85   Ht 5' 7 (1.702  m)   Wt 181 lb (82.1 kg)   SpO2 100%   BMI 28.35 kg/m    Physical Exam Vitals reviewed.  Constitutional:      General: She is not in acute distress.    Appearance: Normal appearance. She is not ill-appearing.  HENT:     Head: Normocephalic and atraumatic.     Right Ear: Tympanic membrane normal.     Left Ear: Tympanic membrane normal.     Nose: Nose normal.     Mouth/Throat:     Mouth: Mucous membranes are moist.     Pharynx: Oropharynx is clear.   Eyes:     Extraocular Movements: Extraocular movements intact.     Conjunctiva/sclera: Conjunctivae normal.     Pupils: Pupils are equal, round, and reactive to light.   Neck:     Vascular: No carotid bruit.   Cardiovascular:     Rate and Rhythm: Normal rate and regular rhythm.     Pulses: Normal pulses.     Heart sounds: Normal heart sounds.  Pulmonary:     Effort: Pulmonary effort is normal.      Breath sounds: Normal breath sounds.  Abdominal:     General: Abdomen is flat. Bowel sounds are normal. There is no distension.     Palpations: Abdomen is soft. There is no mass.     Tenderness: There is no abdominal tenderness. There is no right CVA tenderness, left CVA tenderness, guarding or rebound.  Genitourinary:    Comments: Deferred exam  Musculoskeletal:        General: Normal range of motion.     Cervical back: Normal range of motion and neck supple. No tenderness.     Right lower leg: No edema.     Left lower leg: No edema.     Comments: Left upper traps very tight  Lymphadenopathy:     Cervical: No cervical adenopathy.   Skin:    General: Skin is warm and dry.     Capillary Refill: Capillary refill takes less than 2 seconds.   Neurological:     General: No focal deficit present.     Mental Status: She is alert and oriented to person, place, and time. Mental status is at baseline.   Psychiatric:        Mood and Affect: Mood normal.        Behavior: Behavior normal.        Thought Content: Thought content normal.        Judgment: Judgment normal.           No results found for any visits on 06/29/23.     Assessment & Plan:    Routine Health Maintenance and Physical Exam Discussed health promotion and safety including diet and exercise recommendations, dental health, and injury prevention. Tobacco cessation if applicable. Seat belts, sunscreen, smoke detectors, etc.    Immunization History  Administered Date(s) Administered   HPV Quadrivalent 03/07/2012   Influenza Split 03/07/2012   Influenza,inj,Quad PF,6+ Mos 11/06/2013   PFIZER(Purple Top)SARS-COV-2 Vaccination 04/11/2019, 05/06/2019, 01/13/2020   Td 07/05/2018    Health Maintenance  Topic Date Due   Hepatitis B Vaccines (1 of 3 - 19+ 3-dose series) Never done   COVID-19 Vaccine (4 - 2024-25 season) 10/30/2023 (Originally 09/03/2022)   Pneumococcal Vaccine 58-34 Years old (1 of 2 - PCV) 02/29/2024  (Originally 12/16/2006)   HPV VACCINES (2 - 3-dose series) 06/28/2024 (Originally 04/04/2012)   INFLUENZA VACCINE  08/03/2023   Cervical Cancer Screening (  HPV/Pap Cotest)  08/20/2026   DTaP/Tdap/Td (2 - Tdap) 07/04/2028   Hepatitis C Screening  Completed   HIV Screening  Completed   Meningococcal B Vaccine  Aged Out        Problem List Items Addressed This Visit       Active Problems   Migraine without aura   Planning to start Emgality  soon - not yet filled.       Anxiety   Stable, but having some work-related stress. No SI/HI. Does not wish to change meds right now.       Mixed hyperlipidemia   Previous high cholesterol. -Check lipid panel as part of comprehensive lab work.       Relevant Orders   Comprehensive metabolic panel with GFR   Lipid panel   Other Visit Diagnoses       Annual physical exam    -  Primary   Relevant Orders   CBC with Differential/Platelet   Hemoglobin A1c   Lipid panel   TSH     Screen for STD (sexually transmitted disease)       Relevant Orders   HIV Antibody (routine testing w rflx)   RPR   Cervicovaginal ancillary only     Prediabetes       Relevant Orders   Hemoglobin A1c      Return in about 6 months (around 12/29/2023) for chronic disease management.     Waddell KATHEE Mon, NP

## 2023-06-29 NOTE — Assessment & Plan Note (Signed)
 Stable, but having some work-related stress. No SI/HI. Does not wish to change meds right now.

## 2023-06-30 LAB — HIV ANTIBODY (ROUTINE TESTING W REFLEX): HIV 1&2 Ab, 4th Generation: NONREACTIVE

## 2023-06-30 LAB — RPR: RPR Ser Ql: NONREACTIVE

## 2023-07-02 ENCOUNTER — Ambulatory Visit: Payer: Self-pay | Admitting: Family Medicine

## 2023-07-02 MED ORDER — ROSUVASTATIN CALCIUM 5 MG PO TABS: 5.0000 mg | ORAL_TABLET | Freq: Every day | ORAL | 3 refills | Status: AC

## 2023-07-03 LAB — CERVICOVAGINAL ANCILLARY ONLY
Bacterial Vaginitis (gardnerella): NEGATIVE
Candida Glabrata: NEGATIVE
Candida Vaginitis: NEGATIVE
Chlamydia: NEGATIVE
Comment: NEGATIVE
Comment: NEGATIVE
Comment: NEGATIVE
Comment: NEGATIVE
Comment: NEGATIVE
Comment: NORMAL
Neisseria Gonorrhea: NEGATIVE
Trichomonas: NEGATIVE

## 2023-08-06 ENCOUNTER — Other Ambulatory Visit (HOSPITAL_BASED_OUTPATIENT_CLINIC_OR_DEPARTMENT_OTHER): Payer: Self-pay

## 2023-08-06 ENCOUNTER — Ambulatory Visit (HOSPITAL_BASED_OUTPATIENT_CLINIC_OR_DEPARTMENT_OTHER)
Admission: EM | Admit: 2023-08-06 | Discharge: 2023-08-06 | Disposition: A | Discharge status: Home / Self Care | Attending: Family Medicine | Admitting: Family Medicine

## 2023-08-06 ENCOUNTER — Ambulatory Visit (INDEPENDENT_AMBULATORY_CARE_PROVIDER_SITE_OTHER): Admitting: Radiology

## 2023-08-06 ENCOUNTER — Encounter (HOSPITAL_BASED_OUTPATIENT_CLINIC_OR_DEPARTMENT_OTHER): Payer: Self-pay | Admitting: Emergency Medicine

## 2023-08-06 DIAGNOSIS — M549 Dorsalgia, unspecified: Secondary | ICD-10-CM | POA: Diagnosis not present

## 2023-08-06 DIAGNOSIS — N760 Acute vaginitis: Secondary | ICD-10-CM | POA: Diagnosis not present

## 2023-08-06 DIAGNOSIS — R1031 Right lower quadrant pain: Secondary | ICD-10-CM

## 2023-08-06 DIAGNOSIS — D259 Leiomyoma of uterus, unspecified: Secondary | ICD-10-CM | POA: Insufficient documentation

## 2023-08-06 DIAGNOSIS — R1032 Left lower quadrant pain: Secondary | ICD-10-CM

## 2023-08-06 DIAGNOSIS — R102 Pelvic and perineal pain: Secondary | ICD-10-CM

## 2023-08-06 DIAGNOSIS — R1030 Lower abdominal pain, unspecified: Secondary | ICD-10-CM | POA: Insufficient documentation

## 2023-08-06 MED ORDER — CEFTRIAXONE SODIUM 500 MG IJ SOLR
500.0000 mg | INTRAMUSCULAR | Status: DC
  Administered 2023-08-06: 500 mg via INTRAMUSCULAR

## 2023-08-06 MED ORDER — AZITHROMYCIN 250 MG PO TABS
1000.0000 mg | ORAL_TABLET | ORAL | 0 refills | Status: AC
  Filled 2023-08-06: qty 4, 1d supply, fill #0

## 2023-08-06 NOTE — Discharge Instructions (Addendum)
 Pelvic and abdominal pain: Pelvic exam showed vaginitis with some discharge.  STI swab is pending.  Will adjust the plan of care, if needed once the swab results.  Due to pelvic pain, will treat with ceftriaxone  500 mg injection now and azithromycin  250 mg, #4 pills, once today.  Transvaginal ultrasound was negative for uterine fibroids.  Offered KUB, urinalysis even possible CT scan.  Patient wants to hold off and see how she feels.  We reviewed signs and symptoms of worsening condition and reasons to go to an emergency room.  Follow-up here as needed.

## 2023-08-06 NOTE — ED Provider Notes (Addendum)
 [facility]    CSN: [id number] Arrival date & time: 08/06/23  1222      History   Chief Complaint Chief Complaint  Patient presents with   Back Pain   Pelvic Pain    HPI Julie [name] is a 38 y.o. female.   38 year old female with complaint of abdominal/pelvic pain.  The pain started on her right side but now it is her whole lower pelvic area and she is also having some lower back pain.  Heating pad has helped her pain some.  The pain started on 08/01/2023.  She did have a long car trip and wondered if part of it was just from sit in the car driving.  She does have a history of ovarian cysts.  The history is provided by the spouse.  Back Pain Associated symptoms: abdominal pain and pelvic pain   Associated symptoms: no chest pain, no dysuria and no fever   Pelvic Pain Associated symptoms include abdominal pain. Pertinent negatives include no chest pain and no shortness of breath.    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 Authorizing Provider  azithromycin  (ZITHROMAX ) 250  MG tablet Take 4 tablets (1,000 mg total) by mouth today for complete dose 08/06/23 08/07/23 Yes Ringo Sherod, FNP  Galcanezumab -gnlm (EMGALITY ) 120 MG/ML SOAJ Inject 120 mg into the skin every 30 (thirty) days. Maintenance dose (start 1 month after loading dose). 07/11/23  Yes Almarie Waddell NOVAK, NP  rosuvastatin  (CRESTOR ) 5 MG tablet Take 1 tablet (5 mg total) by mouth daily. 07/02/23  Yes Almarie Waddell NOVAK, NP  sertraline  (ZOLOFT ) 50 MG tablet Take 1.5 tablets (75 mg total) by mouth daily. 05/22/23  Yes Almarie Waddell NOVAK, NP  hydrOXYzine  (VISTARIL ) 25 MG capsule Take 1 capsule (25 mg total) by mouth every 8 (eight) hours as needed. Appt for further refills 05/18/23   Almarie Waddell B, NP  SUMAtriptan  (IMITREX ) 100 MG tablet Take 1 tablet (100 mg total) by mouth every 2 (two) hours as needed for migraine. May repeat in 2 hours if headache persists or recurs. Max dose 2 pills in 24 hours 06/28/22   Almarie Waddell NOVAK, NP    Family History Family History  Problem Relation Age of Onset  Miscarriages / Stillbirths Mother    Hyperlipidemia Mother    Arthritis Mother    Diabetes Mother    Hypertension Mother    Diabetes Father    Alcohol abuse Father    Hypertension Paternal Aunt    Dementia Maternal Grandmother    Parkinson's disease Maternal Grandmother    Hypertension Maternal Grandfather    COPD Paternal Grandmother    Arthritis Paternal Grandmother    Heart disease Paternal Grandfather    COPD Paternal Grandfather    Cancer Paternal Grandfather     Social History Social History   Tobacco Use   Smoking status: Every Day    Current packs/day: 0.50    Types: Cigarettes    Passive exposure: Never   Smokeless tobacco: Never  Substance Use Topics   Alcohol use: Yes    Comment: socially    Drug use: Never     Allergies   Acetaminophen , Codeine, Hydrocodone , Strawberry flavoring agent (non-screening), and Other   Review of Systems Review of Systems  Constitutional:  Negative for chills and fever.   HENT:  Negative for ear pain and sore throat.   Eyes:  Negative for pain and visual disturbance.  Respiratory:  Negative for cough and shortness of breath.   Cardiovascular:  Negative for chest pain and palpitations.  Gastrointestinal:  Positive for abdominal pain. Negative for constipation, diarrhea, nausea and vomiting.  Genitourinary:  Positive for pelvic pain. Negative for dysuria and hematuria.  Musculoskeletal:  Positive for back pain. Negative for arthralgias.  Skin:  Negative for color change and rash.  Neurological:  Negative for seizures and syncope.  All other systems reviewed and are negative.    Physical Exam Triage Vital Signs ED Triage Vitals  Encounter Vitals Group     BP 08/06/23 1239 109/76     Girls Systolic BP Percentile --      Girls Diastolic BP Percentile --      Boys Systolic BP Percentile --      Boys Diastolic BP Percentile --      Pulse Rate 08/06/23 1239 84     Resp 08/06/23 1239 18     Temp 08/06/23 1239 98.4 F (36.9 C)     Temp Source 08/06/23 1239 Oral     SpO2 08/06/23 1239 97 %     Weight --      Height --      Head Circumference --      Peak Flow --      Pain Score 08/06/23 1237 10     Pain Loc --      Pain Education --      Exclude from Growth Chart --    No data found.  Updated Vital Signs BP 109/76 (BP Location: Left Arm)   Pulse 84   Temp 98.4 F (36.9 C) (Oral)   Resp 18   LMP 07/15/2023   SpO2 97%   Visual Acuity Right Eye Distance:   Left Eye Distance:   Bilateral Distance:    Right Eye Near:   Left Eye Near:    Bilateral Near:     Physical Exam Vitals and nursing note reviewed. Exam conducted with a chaperone present Psychologist, prison and probation services CNA).  Constitutional:      General: She is not in acute distress.    Appearance: She is well-developed. She is not ill-appearing or toxic-appearing.  HENT:     Head: Normocephalic and atraumatic.     Right Ear: External ear normal.  Left Ear: External ear normal.     Nose:  Nose normal.     Mouth/Throat:     Lips: Pink.     Mouth: Mucous membranes are moist.  Eyes:     Conjunctiva/sclera: Conjunctivae normal.     Pupils: Pupils are equal, round, and reactive to light.  Cardiovascular:     Rate and Rhythm: Normal rate and regular rhythm.     Heart sounds: S1 normal and S2 normal. No murmur heard. Pulmonary:     Effort: Pulmonary effort is normal. No respiratory distress.     Breath sounds: Normal breath sounds. No decreased breath sounds, wheezing, rhonchi or rales.  Abdominal:     General: Bowel sounds are normal.     Palpations: Abdomen is soft.     Tenderness: There is abdominal tenderness in the right lower quadrant, suprapubic area and left lower quadrant.     Hernia: There is no hernia in the left inguinal area or right inguinal area.  Genitourinary:    Exam position: Lithotomy position.     Labia:        Right: No rash, tenderness, lesion or injury.        Left: No rash, tenderness, lesion or injury.      Urethra: No prolapse, urethral pain, urethral swelling or urethral lesion.     Vagina: No signs of injury and foreign body. Vaginal discharge (Greenish vaginal discharge) present. No erythema, tenderness, bleeding, lesions or prolapsed vaginal walls.     Cervix: No cervical motion tenderness, discharge, friability, lesion, erythema, cervical bleeding or eversion.     Uterus: Not deviated, not enlarged, not fixed, not tender and no uterine prolapse.      Adnexa:        Right: Tenderness (Moderate to severe) present. No mass or fullness.         Left: Tenderness (Mild) present. No mass or fullness.    Musculoskeletal:        General: No swelling.  Lymphadenopathy:     Lower Body: No right inguinal adenopathy. No left inguinal adenopathy.  Skin:    General: Skin is warm and dry.     Capillary Refill: Capillary refill takes less than 2 seconds.     Findings: No rash.  Neurological:     Mental Status: She is alert and oriented to person, place,  and time.  Psychiatric:        Mood and Affect: Mood normal.      UC Treatments / Results  Labs (all labs ordered are listed, but only abnormal results are displayed) Labs Reviewed  CERVICOVAGINAL ANCILLARY ONLY    EKG   Radiology US  PELVIS TRANSVAGINAL NON-OB (TV ONLY) Result Date: 08/06/2023 CLINICAL DATA:  Right-sided pelvic pain and back pain for 5 days. EXAM: ULTRASOUND PELVIS TRANSVAGINAL TECHNIQUE: Transvaginal ultrasound examination of the pelvis was performed including evaluation of the uterus, ovaries, adnexal regions, and pelvic cul-de-sac. COMPARISON:  09/14/2012. FINDINGS: Uterus Measurements: 9.3 x 4.1 x 6.4 cm = volume: 126 mL. Contains several hypoechoic lesions throughout the uterus, measuring up to 2.0 x 2.1 x 2.0 cm. Endometrium Thickness: 11 mm, within normal limits. No focal abnormality visualized. Right ovary Measurements: 2.2 x 1.4 x 1.6 cm = volume: 2.7 mL. Normal appearance/no adnexal mass. Left ovary Not visualized. Other findings:  No abnormal free fluid IMPRESSION: 1. No acute findings. 2. Uterine fibroids. Electronically Signed   By: Newell Eke M.D.   On: 08/06/2023 14:15    Procedures Procedures (including  critical care time)  Medications Ordered in UC Medications  cefTRIAXone  (ROCEPHIN ) injection 500 mg (500 mg Intramuscular Given 08/06/23 1501)    Initial Impression / Assessment and Plan / UC Course  I have reviewed the triage vital signs and the nursing notes.  Pertinent labs & imaging results that were available during my care of the patient were reviewed by me and considered in my medical decision making (see chart for details).  Plan of Care: Pelvic pain and abdominal pain with vaginitis: STI swab collected.  Transvaginal ultrasound is negative for ovarian cyst.  Patient denies urinary symptoms and declined a UA.  Ceftriaxone  500 mg injection now.  Azithromycin  250 mg, #4 pills, once today.  Get plenty of fluids and rest.  If pain persist,  needs to go to an emergency room for further workup.  For now she declined a KUB or CT scan here at the urgent care.  Work excuse provided.  Follow-up as needed.  I reviewed the plan of care with the patient and/or the patient's guardian.  The patient and/or guardian had time to ask questions and acknowledged that the questions were answered.  I provided instruction on symptoms or reasons to return here or to go to an ER, if symptoms/condition did not improve, worsened or if new symptoms occurred.  Final Clinical Impressions(s) / UC Diagnoses   Final diagnoses:  Vaginitis and vulvovaginitis  Pelvic pain  Left lower quadrant abdominal pain  Right lower quadrant abdominal pain  Suprapubic pain     Discharge Instructions      Pelvic and abdominal pain: Pelvic exam showed vaginitis with some discharge.  STI swab is pending.  Will adjust the plan of care, if needed once the swab results.  Due to pelvic pain, will treat with ceftriaxone  500 mg injection now and azithromycin  250 mg, #4 pills, once today.  Transvaginal ultrasound was negative for uterine fibroids.  Offered KUB, urinalysis even possible CT scan.  Patient wants to hold off and see how she feels.  We reviewed signs and symptoms of worsening condition and reasons to go to an emergency room.  Follow-up here as needed.     ED Prescriptions     Medication Sig Dispense Auth. Provider   azithromycin  (ZITHROMAX ) 250 MG tablet Take 4 tablets (1,000 mg total) by mouth today for complete dose 4 tablet Orena Cavazos, FNP      PDMP not reviewed this encounter.   Ival Domino, FNP 08/06/23 1500    Ival Domino, FNP 08/06/23 [phone]

## 2023-08-06 NOTE — ED Triage Notes (Signed)
 Pt c/o pelvic pain started on 07/30 on her right side but now all over pelvic pain, lower back pain. Pt denies any discharge. Pt has used a heating pad. Pt has a history of ovarian cysts

## 2023-08-07 ENCOUNTER — Ambulatory Visit (HOSPITAL_BASED_OUTPATIENT_CLINIC_OR_DEPARTMENT_OTHER): Payer: Self-pay | Admitting: Family Medicine

## 2023-08-07 LAB — CERVICOVAGINAL ANCILLARY ONLY
Bacterial Vaginitis (gardnerella): NEGATIVE
Candida Glabrata: NEGATIVE
Candida Vaginitis: NEGATIVE
Chlamydia: NEGATIVE
Comment: NEGATIVE
Comment: NEGATIVE
Comment: NEGATIVE
Comment: NEGATIVE
Comment: NEGATIVE
Comment: NORMAL
Neisseria Gonorrhea: NEGATIVE
Trichomonas: NEGATIVE

## 2023-08-07 NOTE — Progress Notes (Signed)
 Results are negative.  Patient was advised to check the portal for negative results.  No treatment needed.

## 2023-08-10 ENCOUNTER — Encounter (HOSPITAL_BASED_OUTPATIENT_CLINIC_OR_DEPARTMENT_OTHER): Payer: Self-pay

## 2023-08-10 ENCOUNTER — Emergency Department (HOSPITAL_BASED_OUTPATIENT_CLINIC_OR_DEPARTMENT_OTHER)

## 2023-08-10 ENCOUNTER — Emergency Department (HOSPITAL_BASED_OUTPATIENT_CLINIC_OR_DEPARTMENT_OTHER)
Admission: EM | Admit: 2023-08-10 | Discharge: 2023-08-10 | Disposition: A | Discharge status: Home / Self Care | Attending: Emergency Medicine | Admitting: Emergency Medicine

## 2023-08-10 ENCOUNTER — Other Ambulatory Visit: Payer: Self-pay

## 2023-08-10 DIAGNOSIS — M7918 Myalgia, other site: Secondary | ICD-10-CM

## 2023-08-10 DIAGNOSIS — D649 Anemia, unspecified: Secondary | ICD-10-CM | POA: Insufficient documentation

## 2023-08-10 DIAGNOSIS — D219 Benign neoplasm of connective and other soft tissue, unspecified: Secondary | ICD-10-CM

## 2023-08-10 DIAGNOSIS — D259 Leiomyoma of uterus, unspecified: Secondary | ICD-10-CM | POA: Diagnosis not present

## 2023-08-10 DIAGNOSIS — R1031 Right lower quadrant pain: Secondary | ICD-10-CM | POA: Diagnosis not present

## 2023-08-10 DIAGNOSIS — M791 Myalgia, unspecified site: Secondary | ICD-10-CM | POA: Insufficient documentation

## 2023-08-10 DIAGNOSIS — R1030 Lower abdominal pain, unspecified: Secondary | ICD-10-CM

## 2023-08-10 LAB — CBC WITH DIFFERENTIAL/PLATELET
Abs Immature Granulocytes: 0.02 K/uL (ref 0.00–0.07)
Basophils Absolute: 0.1 K/uL (ref 0.0–0.1)
Basophils Relative: 1 %
Eosinophils Absolute: 0.2 K/uL (ref 0.0–0.5)
Eosinophils Relative: 3 %
HCT: 34.3 % — ABNORMAL LOW (ref 36.0–46.0)
Hemoglobin: 11.4 g/dL — ABNORMAL LOW (ref 12.0–15.0)
Immature Granulocytes: 0 %
Lymphocytes Relative: 26 %
Lymphs Abs: 2 K/uL (ref 0.7–4.0)
MCH: 26.5 pg (ref 26.0–34.0)
MCHC: 33.2 g/dL (ref 30.0–36.0)
MCV: 79.6 fL — ABNORMAL LOW (ref 80.0–100.0)
Monocytes Absolute: 0.6 K/uL (ref 0.1–1.0)
Monocytes Relative: 7 %
Neutro Abs: 4.9 K/uL (ref 1.7–7.7)
Neutrophils Relative %: 63 %
Platelets: 186 K/uL (ref 150–400)
RBC: 4.31 MIL/uL (ref 3.87–5.11)
RDW: 14.2 % (ref 11.5–15.5)
WBC: 7.7 K/uL (ref 4.0–10.5)
nRBC: 0 % (ref 0.0–0.2)

## 2023-08-10 LAB — URINALYSIS, ROUTINE W REFLEX MICROSCOPIC
Bilirubin Urine: NEGATIVE
Glucose, UA: NEGATIVE mg/dL
Hgb urine dipstick: NEGATIVE
Ketones, ur: NEGATIVE mg/dL
Leukocytes,Ua: NEGATIVE
Nitrite: NEGATIVE
Protein, ur: 30 mg/dL — AB
Specific Gravity, Urine: 1.025 (ref 1.005–1.030)
pH: 6 (ref 5.0–8.0)

## 2023-08-10 LAB — COMPREHENSIVE METABOLIC PANEL WITH GFR
ALT: 17 U/L (ref 0–44)
AST: 28 U/L (ref 15–41)
Albumin: 4.4 g/dL (ref 3.5–5.0)
Alkaline Phosphatase: 69 U/L (ref 38–126)
Anion gap: 10 (ref 5–15)
BUN: 19 mg/dL (ref 6–20)
CO2: 22 mmol/L (ref 22–32)
Calcium: 9.3 mg/dL (ref 8.9–10.3)
Chloride: 106 mmol/L (ref 98–111)
Creatinine, Ser: 0.74 mg/dL (ref 0.44–1.00)
GFR, Estimated: >60 mL/min (ref >60)
Glucose, Bld: 102 mg/dL — ABNORMAL HIGH (ref 70–99)
Potassium: 3.9 mmol/L (ref 3.5–5.1)
Sodium: 137 mmol/L (ref 135–145)
Total Bilirubin: 0.7 mg/dL (ref 0.0–1.2)
Total Protein: 6.9 g/dL (ref 6.5–8.1)

## 2023-08-10 LAB — URINALYSIS, MICROSCOPIC (REFLEX)

## 2023-08-10 LAB — HCG, SERUM, QUALITATIVE: Preg, Serum: NEGATIVE

## 2023-08-10 LAB — LIPASE, BLOOD: Lipase: 21 U/L (ref 11–51)

## 2023-08-10 MED ORDER — IOHEXOL 300 MG/ML  SOLN
100.0000 mL | Freq: Once | INTRAMUSCULAR | Status: AC | PRN
  Administered 2023-08-10: 100 mL via INTRAVENOUS

## 2023-08-10 MED ORDER — METHOCARBAMOL 500 MG PO TABS: 500.0000 mg | ORAL_TABLET | Freq: Two times a day (BID) | ORAL | 0 refills | Status: AC

## 2023-08-10 MED ORDER — FENTANYL CITRATE PF 50 MCG/ML IJ SOSY
50.0000 ug | PREFILLED_SYRINGE | Freq: Once | INTRAMUSCULAR | Status: AC
  Administered 2023-08-10: 50 ug via INTRAVENOUS
  Filled 2023-08-10: qty 1

## 2023-08-10 MED ORDER — MELOXICAM 15 MG PO TABS: 15.0000 mg | ORAL_TABLET | Freq: Every day | ORAL | 0 refills | Status: AC

## 2023-08-10 MED ORDER — SODIUM CHLORIDE 0.9 % IV BOLUS
1000.0000 mL | Freq: Once | INTRAVENOUS | Status: AC
  Administered 2023-08-10: 1000 mL via INTRAVENOUS

## 2023-08-10 NOTE — ED Notes (Signed)
 ED Provider at bedside.

## 2023-08-10 NOTE — Discharge Instructions (Signed)
 Take Meloxicam  as prescribed, take with food. Discontinue if you develop stomach pain. Take Robaxin  as prescribed, do not drive if taking this medication. You can also try over the counter lidocaine patches. You can apply warm compress to your lower back and hip area, follow with gentle stretching.   Please follow up with your primary care provider for recheck and possible referral to physical therapy. Return to the ER for worsening or concerning symptoms.

## 2023-08-10 NOTE — ED Notes (Signed)
 Patient transported to CT

## 2023-08-10 NOTE — ED Triage Notes (Signed)
 Pt states that she is having pelvic, back and abdominal pain. States that she was seen at [facility] for the pain and she thinks she may be having a reaction to medication given at [facility].

## 2023-08-10 NOTE — ED Provider Notes (Signed)
 [facility] Provider Note   CSN: [id number] Arrival date & time: 08/10/23  9075     Patient presents with: multiple complaints   Julie [name] is a 38 y.o. female.   38yo female presenting with 9 days of bilateral lower abdominal, pelvic, and low back pain. The pain is described as 12/10, constant, worse on the right side. The pain began following a 6-hour car ride, initially described as stiffness which progressed to a persistent pain. She denies any specific injury or trauma to the area. She was seen in UC 8/4 for these symptoms and treated for presumed STI. Results came back negative for infection. Her symptoms have since worsened in severity and she now states she is having trouble walking due to the pain. No weakness, numbness/tingling, loss of bowel or bladder function. She does report several episodes of loose stool following the antibiotic treatment which has since resolved. Patient endorses intermittent episodes of nausea and loss of appetite. No fevers, chills, chest pain, shortness of breath. LMP 07/15/23.       Prior to Admission medications   Medication Sig Start Date End Date Taking? Authorizing Provider  meloxicam  (MOBIC ) 15 MG tablet Take 1 tablet (15 mg total) by mouth daily for 10 days. 08/10/23 08/20/23 Yes Beverley Leita LABOR, PA-C  methocarbamol  (ROBAXIN ) 500 MG tablet Take 1 tablet (500 mg total) by mouth 2 (two) times daily. 08/10/23  Yes Beverley Leita LABOR, PA-C  Galcanezumab -gnlm (EMGALITY ) 120 MG/ML SOAJ Inject 120 mg into the skin every 30 (thirty) days. Maintenance dose (start 1 month after loading dose). 07/11/23   Almarie Waddell NOVAK, NP  hydrOXYzine  (VISTARIL ) 25 MG capsule Take 1 capsule (25 mg total) by mouth every 8 (eight) hours as needed. Appt for further refills 05/18/23   Almarie Waddell B, NP  rosuvastatin  (CRESTOR ) 5 MG tablet Take 1 tablet (5 mg total) by mouth daily. 07/02/23   Almarie Waddell NOVAK, NP  sertraline  (ZOLOFT ) 50 MG  tablet Take 1.5 tablets (75 mg total) by mouth daily. 05/22/23   Almarie Waddell NOVAK, NP  SUMAtriptan  (IMITREX ) 100 MG tablet Take 1 tablet (100 mg total) by mouth every 2 (two) hours as needed for migraine. May repeat in 2 hours if headache persists or recurs. Max dose 2 pills in 24 hours 06/28/22   Almarie Waddell NOVAK, NP    Allergies: Acetaminophen , Codeine, Hydrocodone , Strawberry flavoring agent (non-screening), and Other    Review of Systems Negative except as per HPI Updated Vital Signs BP (!) 105/56   Pulse 63   Temp 97.8 F (36.6 C) (Oral)   Resp 18   Ht 5' 7 (1.702 m)   Wt 81.6 kg   LMP 07/15/2023 (Exact Date)   SpO2 100%   BMI 28.19 kg/m   Physical Exam Vitals and nursing note reviewed.  Constitutional:      General: She is not in acute distress.    Appearance: She is well-developed. She is not diaphoretic.  HENT:     Head: Normocephalic and atraumatic.  Cardiovascular:     Rate and Rhythm: Normal rate and regular rhythm.     Heart sounds: Normal heart sounds.  Pulmonary:     Effort: Pulmonary effort is normal.     Breath sounds: Normal breath sounds.  Abdominal:     Palpations: Abdomen is soft.     Tenderness: There is abdominal tenderness in the right lower quadrant. There is right CVA tenderness and guarding. There is no  left CVA tenderness or rebound.  Musculoskeletal:       Back:     Right lower leg: No edema.     Left lower leg: No edema.       Legs:     Comments: Pain with log roll right hip, as well as external rotation and flexion of the hip. TTP right lateral hip. Localized to right groin area with movement of the right hip  Skin:    General: Skin is warm and dry.     Findings: No erythema or rash.  Neurological:     Mental Status: She is alert and oriented to person, place, and time.     Sensory: No sensory deficit.     Motor: No weakness.  Psychiatric:        Behavior: Behavior normal.     (all labs ordered are listed, but only abnormal results are  displayed) Labs Reviewed  URINALYSIS, ROUTINE W REFLEX MICROSCOPIC - Abnormal; Notable for the following components:      Result Value   Protein, ur 30 (*)    All other components within normal limits  CBC WITH DIFFERENTIAL/PLATELET - Abnormal; Notable for the following components:   Hemoglobin 11.4 (*)    HCT 34.3 (*)    MCV 79.6 (*)    All other components within normal limits  COMPREHENSIVE METABOLIC PANEL WITH GFR - Abnormal; Notable for the following components:   Glucose, Bld 102 (*)    All other components within normal limits  URINALYSIS, MICROSCOPIC (REFLEX) - Abnormal; Notable for the following components:   Bacteria, UA RARE (*)    All other components within normal limits  HCG, SERUM, QUALITATIVE  LIPASE, BLOOD    EKG: None  Radiology: CT ABDOMEN PELVIS W CONTRAST Result Date: 08/10/2023 CLINICAL DATA:  Right lower quadrant pain EXAM: CT ABDOMEN AND PELVIS WITH CONTRAST TECHNIQUE: Multidetector CT imaging of the abdomen and pelvis was performed using the standard protocol following bolus administration of intravenous contrast. RADIATION DOSE REDUCTION: This exam was performed according to the departmental dose-optimization program which includes automated exposure control, adjustment of the mA and/or kV according to patient size and/or use of iterative reconstruction technique. CONTRAST:  OMNIPAQUE  IOHEXOL  300 MG/ML  SOLN COMPARISON:  None Available. FINDINGS: Lower chest: No acute abnormality Hepatobiliary: No focal hepatic abnormality. Gallbladder unremarkable. Pancreas: No focal abnormality or ductal dilatation. Spleen: No focal abnormality.  Normal size. Adrenals/Urinary Tract: No adrenal abnormality. No focal renal abnormality. No stones or hydronephrosis. Urinary bladder is unremarkable. Stomach/Bowel: Stomach, large and small bowel grossly unremarkable. Normal appendix. Vascular/Lymphatic: No evidence of aneurysm or adenopathy. Reproductive: Enlarged uterus with  numerous fibroids. No adnexal mass. Other: No free fluid or free air. Musculoskeletal: No acute bony abnormality. IMPRESSION: No acute findings in the abdomen or pelvis. Fibroid uterus. Electronically Signed   By: Franky Crease M.D.   On: 08/10/2023 12:33     Procedures   Medications Ordered in the ED  sodium chloride  0.9 % bolus 1,000 mL (0 mLs Intravenous Stopped 08/10/23 1315)  fentaNYL  (SUBLIMAZE ) injection 50 mcg (50 mcg Intravenous Given 08/10/23 1117)  iohexol  (OMNIPAQUE ) 300 MG/ML solution 100 mL (100 mLs Intravenous Contrast Given 08/10/23 1158)                                    Medical Decision Making Amount and/or Complexity of Data Reviewed Labs: ordered. Radiology: ordered.  Risk Prescription  drug management.   This patient presents to the ED for concern of right lower quadrant/hip pain, this involves an extensive number of treatment options, and is a complaint that carries with it a high risk of complications and morbidity.  The differential diagnosis includes but not limited to musculoskeletal pain, appendicitis, ovarian cyst or torsion, intra-abdominal mass, pelvic vascular congestion, sciatica, bursitis, OA hip   Co morbidities / Chronic conditions that complicate the patient evaluation  Fibroids, hx of ovarian cyst    Additional history obtained:  Additional history obtained from EMR External records from outside source obtained and reviewed including prior visit to urgent care, STI panel negative.   Lab Tests:  I Ordered, and personally interpreted labs.  The pertinent results include: CBC without significant findings, very mild anemia with hemoglobin of 11.4.  CMP without significant findings.  Lipase normal.  Urinalysis is contaminated with squamous epithelials, does have trace protein, negative for nitrates and leukocytes.  hCG is negative.   Imaging Studies ordered:  I ordered imaging studies including CT abdomen and pelvis I independently visualized and  interpreted imaging which showed no acute process; fibroids  I agree with the radiologist interpretation   Problem List / ED Course / Critical interventions / Medication management  38 year old female presents the ER with complaint of right lower pelvic pain with complaint of pain in her right lower back and right hip/groin areas.  Symptoms started about 9 days ago following a 6-hour car ride.  She states that she has history of sciatica but this was worse than anything she has had in the past.  Patient went to urgent care where she had a pelvic ultrasound showing fibroids without ovarian cyst, she was treated for possible PID with antibiotics although her swabs came back negative and her pain persisted.  She did develop diarrhea and loss of appetite after taking the antibiotics and had 1 episode of vomiting that day but states that she did keep the medications down.  Patient has not had fevers.  Her labs overall are reassuring.  Her CT scan does not show any acute findings today.  Her pain is very much reproduced with palpation and range of motion of her right hip and right lower back areas.  Plan is to treat as musculoskeletal injury with NSAID and muscle relaxer.  Recommend warm compresses and gentle stretching.  Encouraged to follow-up with primary care provider, may benefit from physical therapy or referral to orthopedics if not improving. I ordered medication including fentanyl , IV fluids Reevaluation of the patient after these medicines showed that the patient remained stable I have reviewed the patients home medicines and have made adjustments as needed   Social Determinants of Health:  Has PCP on file    Test / Admission - Considered:  Stable for dc      Final diagnoses:  Lower abdominal pain  Musculoskeletal pain  Fibroids    ED Discharge Orders          Ordered    methocarbamol  (ROBAXIN ) 500 MG tablet  2 times daily        08/10/23 1250    meloxicam  (MOBIC ) 15 MG tablet   Daily        08/10/23 1250               Beverley Leita LABOR, PA-C 08/10/23 1323    Armenta Canning, MD 08/16/23 [phone]

## 2023-08-30 ENCOUNTER — Encounter: Payer: Self-pay | Admitting: Family Medicine

## 2023-08-30 ENCOUNTER — Ambulatory Visit (HOSPITAL_BASED_OUTPATIENT_CLINIC_OR_DEPARTMENT_OTHER)
Admission: RE | Admit: 2023-08-30 | Discharge: 2023-08-30 | Disposition: A | Discharge status: Home / Self Care | Source: Ambulatory Visit | Attending: Family Medicine | Admitting: Family Medicine

## 2023-08-30 ENCOUNTER — Ambulatory Visit (INDEPENDENT_AMBULATORY_CARE_PROVIDER_SITE_OTHER): Admitting: Family Medicine

## 2023-08-30 VITALS — BP 104/66 | HR 91 | Ht 67.0 in | Wt 186.0 lb

## 2023-08-30 DIAGNOSIS — M545 Low back pain, unspecified: Secondary | ICD-10-CM | POA: Diagnosis not present

## 2023-08-30 DIAGNOSIS — F419 Anxiety disorder, unspecified: Secondary | ICD-10-CM

## 2023-08-30 MED ORDER — PREDNISONE 20 MG PO TABS: 40.0000 mg | ORAL_TABLET | Freq: Every day | ORAL | 0 refills | Status: AC

## 2023-08-30 NOTE — Progress Notes (Signed)
 Acute Office Visit  Subjective:     Patient ID: Julie [name], female    DOB: 1985-03-08, 38 y.o.   MRN: [id number]  Chief Complaint  Patient presents with   Back Pain   Anxiety     Patient is in today for back pain.  Discussed the use of AI scribe software for clinical note transcription with the patient, who gave verbal consent to proceed.  History of Present Illness Julie [name] is a 38 year old female who presents with persistent back pain.  She has been experiencing significant back pain for approximately three weeks. The pain originates in the left lower back and can radiate to the right side and sometimes around to her chest/abdomen. It is described as 'stabbing' and is exacerbated by movements such as coughing, laughing, or moving the wrong way. The pain affects her ability to stand and drive, causing discomfort during her 45-minute commute to work. She finds some relief when elevating her legs and lying back. Despite taking muscle relaxers and meloxicam , she reports minimal relief from the pain.  A prior workup included blood tests, urine analysis, a negative pregnancy test, a pelvic ultrasound, and a CT scan of the abdomen and pelvis, all of which returned normal results. She has not had an x-ray of her back yet. She has been prescribed muscle relaxers and meloxicam , which she takes primarily at night. She has also received pain meds and fluids via IV during a previous ED visit.  She is participating in a physical therapy program provided by her workplace, which includes both virtual sessions with a licensed physical therapist and prerecorded videos. She attempts to engage in these exercises daily.  No loss of bowel or bladder control, saddle paresthesia, urinary symptoms, or rashes.  She reports the pain and impact on her work seems to be worsening her anxiety. No SI/HI.       All review of systems negative except what is listed in the HPI       Objective:    BP 104/66   Pulse 91   Ht 5' 7 (1.702 m)   Wt 186 lb (84.4 kg)   LMP 07/15/2023 (Exact Date)   SpO2 99%   BMI 29.13 kg/m    Physical Exam Vitals reviewed.  Constitutional:      Appearance: Normal appearance.  Musculoskeletal:     Thoracic back: No bony tenderness.     Lumbar back: Tenderness present. No swelling or bony tenderness. Decreased range of motion.  Skin:    General: Skin is warm and dry.     Findings: No rash.  Neurological:     Mental Status: She is alert and oriented to person, place, and time.  Psychiatric:        Mood and Affect: Mood normal.        Behavior: Behavior normal.        Thought Content: Thought content normal.        Judgment: Judgment normal.        No results found for any visits on 08/30/23.      Assessment & Plan:   Problem List Items Addressed This Visit       Active Problems   Anxiety   Other Visit Diagnoses       Acute left-sided low back pain without sciatica    -  Primary   Relevant Medications   predniSONE  (DELTASONE ) 20 MG tablet   Other Relevant Orders   DG Lumbar Spine 2-3  Views       Assessment & Plan Low back pain ACute low back pain with radiation, minimally responsive to current treatment.  - Order x-ray of the back. - Prescribe prednisone  burst for five days, instruct to take early in the day. - Pause meloxicam  while on prednisone , then restart after completion. - Continue muscle relaxer at bedtime. - Encourage stretching, heat, and massage. - Consider in-person physical therapy or Ortho referral if no improvement. - Recommend work from home to avoid prolonged driving and physical strain. - Provide work note recommending work from home.  Anxiety Anxiety possibly worsened by chronic pain and stress. Current management with sertraline . - Monitor mood improvement with pain management. - Consider further intervention if mood does not improve after pain management.     Meds ordered  this encounter  Medications   predniSONE  (DELTASONE ) 20 MG tablet    Sig: Take 2 tablets (40 mg total) by mouth daily with breakfast for 5 days.    Dispense:  10 tablet    Refill:  0    Supervising Provider:   DOMENICA BLACKBIRD A [[id number]]    Return for - pending results or sooner if needed.  Waddell KATHEE Mon, NP

## 2023-09-05 ENCOUNTER — Encounter: Payer: Self-pay | Admitting: Family Medicine

## 2023-09-11 ENCOUNTER — Ambulatory Visit: Payer: Self-pay | Admitting: Family Medicine
